# Patient Record
Sex: Male | Born: 1935 | Race: White | Hispanic: No | Marital: Married | Smoking: Current every day smoker
Health system: Southern US, Community
[De-identification: ages and names within clinical notes are randomized; demographics above are authoritative.]

## PROBLEM LIST (undated history)

## (undated) DIAGNOSIS — J45909 Unspecified asthma, uncomplicated: Secondary | ICD-10-CM

## (undated) DIAGNOSIS — J449 Chronic obstructive pulmonary disease, unspecified: Secondary | ICD-10-CM

## (undated) DIAGNOSIS — C801 Malignant (primary) neoplasm, unspecified: Secondary | ICD-10-CM

---

## 1999-01-06 ENCOUNTER — Ambulatory Visit (HOSPITAL_COMMUNITY): Admission: RE | Admit: 1999-01-06 | Discharge: 1999-01-06 | Payer: Self-pay | Admitting: Internal Medicine

## 2000-05-30 ENCOUNTER — Ambulatory Visit (HOSPITAL_COMMUNITY): Admission: RE | Admit: 2000-05-30 | Discharge: 2000-05-30 | Payer: Self-pay | Admitting: Gastroenterology

## 2000-05-30 ENCOUNTER — Encounter (INDEPENDENT_AMBULATORY_CARE_PROVIDER_SITE_OTHER): Payer: Self-pay | Admitting: Specialist

## 2000-08-16 ENCOUNTER — Ambulatory Visit (HOSPITAL_BASED_OUTPATIENT_CLINIC_OR_DEPARTMENT_OTHER): Admission: RE | Admit: 2000-08-16 | Discharge: 2000-08-16 | Payer: Self-pay | Admitting: Orthopedic Surgery

## 2000-08-16 ENCOUNTER — Emergency Department (HOSPITAL_COMMUNITY): Admission: EM | Admit: 2000-08-16 | Discharge: 2000-08-16 | Payer: Self-pay | Admitting: Emergency Medicine

## 2000-08-16 ENCOUNTER — Encounter: Payer: Self-pay | Admitting: Emergency Medicine

## 2003-08-01 ENCOUNTER — Encounter: Admission: RE | Admit: 2003-08-01 | Discharge: 2003-08-01 | Payer: Self-pay | Admitting: Internal Medicine

## 2003-08-19 ENCOUNTER — Encounter (INDEPENDENT_AMBULATORY_CARE_PROVIDER_SITE_OTHER): Payer: Self-pay | Admitting: *Deleted

## 2003-08-19 ENCOUNTER — Observation Stay (HOSPITAL_COMMUNITY): Admission: RE | Admit: 2003-08-19 | Discharge: 2003-08-20 | Payer: Self-pay | Admitting: Urology

## 2003-09-10 ENCOUNTER — Inpatient Hospital Stay (HOSPITAL_COMMUNITY): Admission: RE | Admit: 2003-09-10 | Discharge: 2003-09-20 | Payer: Self-pay | Admitting: Urology

## 2003-09-10 ENCOUNTER — Encounter (INDEPENDENT_AMBULATORY_CARE_PROVIDER_SITE_OTHER): Payer: Self-pay | Admitting: Specialist

## 2003-11-25 ENCOUNTER — Ambulatory Visit (HOSPITAL_COMMUNITY): Admission: RE | Admit: 2003-11-25 | Discharge: 2003-11-25 | Payer: Self-pay | Admitting: Urology

## 2003-12-19 ENCOUNTER — Ambulatory Visit (HOSPITAL_COMMUNITY): Admission: RE | Admit: 2003-12-19 | Discharge: 2003-12-19 | Payer: Self-pay | Admitting: Urology

## 2004-03-02 ENCOUNTER — Ambulatory Visit (HOSPITAL_COMMUNITY): Admission: RE | Admit: 2004-03-02 | Discharge: 2004-03-02 | Payer: Self-pay | Admitting: Urology

## 2004-06-01 ENCOUNTER — Ambulatory Visit (HOSPITAL_COMMUNITY): Admission: RE | Admit: 2004-06-01 | Discharge: 2004-06-01 | Payer: Self-pay | Admitting: Urology

## 2004-11-22 ENCOUNTER — Ambulatory Visit (HOSPITAL_COMMUNITY): Admission: RE | Admit: 2004-11-22 | Discharge: 2004-11-22 | Payer: Self-pay | Admitting: Urology

## 2005-03-02 ENCOUNTER — Ambulatory Visit (HOSPITAL_COMMUNITY): Admission: RE | Admit: 2005-03-02 | Discharge: 2005-03-02 | Payer: Self-pay | Admitting: Urology

## 2005-07-04 ENCOUNTER — Ambulatory Visit (HOSPITAL_COMMUNITY): Admission: RE | Admit: 2005-07-04 | Discharge: 2005-07-04 | Payer: Self-pay | Admitting: Urology

## 2005-10-18 ENCOUNTER — Ambulatory Visit (HOSPITAL_COMMUNITY): Admission: RE | Admit: 2005-10-18 | Discharge: 2005-10-18 | Payer: Self-pay | Admitting: Internal Medicine

## 2005-10-18 ENCOUNTER — Encounter: Payer: Self-pay | Admitting: Vascular Surgery

## 2007-12-12 ENCOUNTER — Encounter (INDEPENDENT_AMBULATORY_CARE_PROVIDER_SITE_OTHER): Payer: Self-pay | Admitting: Internal Medicine

## 2007-12-12 ENCOUNTER — Ambulatory Visit: Payer: Self-pay | Admitting: Surgery

## 2007-12-12 ENCOUNTER — Ambulatory Visit (HOSPITAL_COMMUNITY): Admission: RE | Admit: 2007-12-12 | Discharge: 2007-12-12 | Payer: Self-pay | Admitting: Internal Medicine

## 2008-10-24 ENCOUNTER — Encounter: Admission: RE | Admit: 2008-10-24 | Discharge: 2008-10-24 | Payer: Self-pay | Admitting: Internal Medicine

## 2009-12-16 ENCOUNTER — Encounter
Admission: RE | Admit: 2009-12-16 | Discharge: 2009-12-16 | Payer: Self-pay | Source: Home / Self Care | Admitting: Internal Medicine

## 2010-05-28 NOTE — Discharge Summary (Signed)
NAME:  Terry Valdez, Terry Valdez                           ACCOUNT NO.:  [id number]   MEDICAL RECORD NO.:  [id number]                   PATIENT TYPE:  OBV   LOCATION:  0381                                 FACILITY:  [facility]   PHYSICIAN:  Rozanna Boer., M.D.      DATE OF BIRTH:  Apr 13, 1921   DATE OF ADMISSION:  08/19/2003  DATE OF DISCHARGE:                                 DISCHARGE SUMMARY   DISCHARGE DIAGNOSES:  1. 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  2. Hematuria.  3. Solitary right kidney.   OPERATIONS/PROCEDURES:  Transurethral resection large bladder tumor, August 19, 2003.   BRIEF HISTORY:  This 77 year old patient is admitted with a 11-month history  of intermittent gross painless hematuria and was found to have multiple  bladder tumors on office cystoscopy.  His previous left kidney had been  removed at age 72 which was apparently a pelvic kidney that was painful.  His renal function was normal.   PAST MEDICAL HISTORY:  Positive for hypertension.  No myocardial infarction.  Negative urine culture. Renal ultrasound showed a normal right kidney.   MEDICATIONS:  1. Zocor 20 mg daily.  2. Hydrochlorothiazide 25 mg 1/2 tablet daily.  3. He has been of his aspirin for 1 week.   ALLERGIES:  No drug allergies.   SURGICAL HISTORY:  His only other surgery was an appendectomy at age 30 and  a hernia at age 67.   After satisfactory preop evaluation he was taken to the operating room where  he underwent a transurethral resection of a large bladder tumor that was  contiguous to sides of both left and right posterior base, trigone and  anterior wall, over 5 cm of bladder was resected.  He did well  postoperatively.  He received Mitomycin C 40 mg at 40 cc in the recovery  room.  Postoperatively his creatinine was 1.2, stable.  His hematocrit was  36 and stable, white count was normal.  His urine was clear.   DISPOSITION:  He  was sent home on the first postoperative day.  He and his  family were made aware of the pathology report and will most likely need a  cystectomy.  He will need to have further metastatic work up.  He went home  of the above medications plus Hydrocodone for pain.  He will call regarding any problems.  He has an appointment on August 26, 2003, will bring his family and discuss further treatment options at that  time.   CONDITION ON DISCHARGE:  He was sent home improved in ambulatory condition  on a regular diet.                                               [place]  Shelle Iron., M.D.    HMK/MEDQ  D:  08/20/2003  T:  08/20/2003  Job:  [id number]

## 2010-05-28 NOTE — Op Note (Signed)
NAME:  Terry Valdez, Terry Valdez                           ACCOUNT NO.:  [id number]   MEDICAL RECORD NO.:  [id number]                   PATIENT TYPE:  AMB   LOCATION:  DAY                                  FACILITY:  [facility]   PHYSICIAN:  Rozanna Boer., M.D.      DATE OF BIRTH:  1921-06-17   DATE OF PROCEDURE:  08/19/2003  DATE OF DISCHARGE:                                 OPERATIVE REPORT   PREOPERATIVE DIAGNOSIS:  Multiple bladder tumors in contiguous sites, left  posterior, right posterior, anterior wall, and trigone.   POSTOPERATIVE DIAGNOSES:  Multiple bladder tumors in contiguous sites, left  posterior, right posterior, anterior wall, and trigone.   OPERATION:  TUR bladder tumor (large greater than 5 cm).   ANESTHESIA:  Subarachnoid block.   SURGEON:  Courtney Paris, M.D.   BRIEF HISTORY:  This 77 year old patient presented with a 2-3 month history  of gross intermittent painless hematuria and was found to have multiple  bladder tumors on cystoscopy.  He had his left kidney removed at age 55.  Apparently, this was a pelvic kidney that was painful.  His renal function  tests were normal with a creatinine of 1.2.  He enters now for resection of  this rather extensive tumor which covered both the left and right posterior  base, trigone, and anterior wall in contiguous sites.  Because this is a  large resection and because of his age, he will be admitted for  postoperative care after surgery.   DESCRIPTION OF PROCEDURE:  The patient was placed on the operating table in  the dorsal lithotomy position.  After satisfactory induction of spinal  anesthesia, was prepped and draped with Betadine in the usual sterile  fashion.  The urethra was dilated with Sissy Hoff sounds to 28 [place] and 26  Olympus continuous-flow resectoscope was inserted, and the bladder  inspected.  Pictures were taken of the large tumors, the largest of which  were in the posterior base, both on the left and  right side with some fairly  large exophytic tumors.  There were several smaller tumors that were also  papillary in nature on the trigone, extending up to the left lateral wall  anteriorly and over onto the right lateral wall as well.  Using the  continuous-flow resectoscope with the bladder wall loop, these were  carefully shaved off the bladder.  An area of about a third of the bladder  wall was resected which included all of these tumors.  Hemostasis was good  with cautery, and the right orifice appeared to be close to but not involved  by the resection.  The left orifice was resected as he had had the previous  left kidney removed previously.  When the scope was removed, after removing  all the chips from the bladder, a 24 Ainsworth catheter was inserted, and  the irrigant was clear.  The patient was then taken to the recovery room  where he will have Mitomycin C instilled 40 mg in 40 mL in 30 timed minutes  and the bladder irrigated afterwards.  He will be kept for postoperative  recovery, but he went to the recovery room in satisfactory condition.                                               Rozanna Boer., M.D.    HMK/MEDQ  D:  08/19/2003  T:  08/19/2003  Job:  [id number]

## 2010-05-28 NOTE — Op Note (Signed)
[place]  Patient:    Terry Valdez, Terry Valdez                        MRN: [id number] Proc. Date: 08/16/00 Adm. Date:  [id number] Attending:  Marlowe Shores                           Operative Report  PREOPERATIVE DIAGNOSIS: Right hand volar lacerations of index and ring fingers.  POSTOPERATIVE DIAGNOSIS: Right hand volar lacerations of index and ring fingers.  OPERATION/PROCEDURE: Repair of profundus tendon distally, left index finger, and microscopic repair of radial digital nerve of index finger and right ring finger, as well as simple laceration repair of ring finger on the right.  SURGEON: Artist Pais. Mina Marble, M.D.  ASSISTANT: Junius Roads. Ireton, P.A.C.  ANESTHESIA: General.  TEQUIN TIME: Forty-eight minutes.  COMPLICATIONS: None.  DRAINS: None.  DESCRIPTION OF PROCEDURE: The patient was taken to the operating room and after induction of adequate general anesthesia the right upper extremity was prepped and draped in the usual sterile fashion.  An Esmarch was used to exsanguinate the limb and the tourniquet was inflated to 250 mm Hg.  At this point in time the index finger laceration was approached and debrided of clot and nonviable material.  There was an obvious open injury of the distal interphalangeal joint with laceration of 60-70% of the profundus tendon as well as the radial neurovascular bundle.  The profundus tendon was repaired with 4-0 Mersilene followed by a 6-0 epitendinous Prolene stitch.  After this was done the microscope was brought onto the field and under microscopic magnification the radial digital nerve was repaired using 9-0 nylon x 4 sutures.  This wound was thoroughly irrigated and then closed loosely with 5-0 nylon.  The ring finger also had a small volar laceration that was debrided of clot and nonviable material and simple closure undertaken using 5-0 nylon. That laceration was 2.5 cm in length.  The patient  then had Marcaine blocks to the fingers for postoperative pain control and was placed in a sterile dressing of Xeroform, 4 x 4s, fluffs, and dorsal extension block splint.  The patient tolerated the procedure well and went to the recovery room in stable fashion. DD:  08/16/00 TD:  08/17/00 Job: [id number]

## 2010-05-28 NOTE — Op Note (Signed)
NAME:  Terry Valdez, Terry Valdez                           ACCOUNT NO.:  [id number]   MEDICAL RECORD NO.:  [id number]                   PATIENT TYPE:  INP   LOCATION:  0163                                 FACILITY:  [facility]   PHYSICIAN:  Bertram Millard. Dahlstedt, M.D.          DATE OF BIRTH:  February 15, 1921   DATE OF PROCEDURE:  09/11/2003  DATE OF DISCHARGE:                                 OPERATIVE REPORT   PREOPERATIVE DIAGNOSIS:  Bladder cancer.   POSTOPERATIVE DIAGNOSIS:  Bladder cancer.   PROCEDURES PERFORMED:  1.  Radical cystoprostatectomy.  2.  Bilateral pelvic lymph node dissection.  3.  Creation of ileal conduit urinary diversion.   SURGEON:  Dr. Marcine Matar   ASSISTANT:  Dr. Thyra Breed   ANESTHESIA:  General endotracheal.   ESTIMATED BLOOD LOSS:  700 mL.   DRAINS:  1.  Ureteral stent into urostomy appliance.  2.  #10 Blake drain to bulb suction.   SPECIMENS:  1.  Bilateral pelvic lymph nodes.  2.  Prostate and bladder en bloc.   COMPLICATIONS:  None.   INDICATIONS FOR PROCEDURE:  Terry Valdez is a pleasant 76 year old male, who  was initially evaluated by Dr. Aldean Ast for intermittent gross hematuria.  Upon initial evaluation, it was also discovered that the patient had a  solitary kidney on the right following removal of a pelvic kidney at age 58.  Cystoscopy was performed and showed multiple bladder tumors.  The patient  then underwent transurethral resection of these tumors with a pathologic  diagnosis of high-grade invasive papillary urothelial carcinoma with deep  muscle involvement.  The patient also had a CT scan which showed no evidence  of the metastatic disease.  Given the findings, the patient was counseled on  the risks, benefits, and alternatives of undergoing a radical cystectomy  with ileal conduit urinary diversion for his solitary kidney.  The patient  understands all the risks, benefits, and alternatives and is willing to  proceed with the  procedure.   PROCEDURE IN DETAIL:  Following identification by his arm bracelet, the  patient was brought to the operating room and placed in the supine position.  Here, he received preoperative IV antibiotics and underwent success  induction of general endotracheal anesthesia.  His entire abdomen was then  shaved, prepped with Betadine, then draped in the usual sterile fashion.  We  then placed an 36 French Foley catheter and drained the bladder.  A midline  vertical infraumbilical incision was then made in the skin with a #15 blade  scalpel.  Bovie electrocautery was then used to incise Camper and Scarpa's  fascia down to the level of the rectus fascia.  The rectus sheath was then  incised using the Bovie, exposing the two bellies of the rectus abdominus  muscles.  The transversalis fascia between the two rectus muscles was then  incised using Metzenbaum scissors.  This revealed the space of Retzius.  This  was further dissected bluntly using the surgeon's hand.  At the  superior aspect of the wound, the peritoneal reflection was identified.  This was grasped with pick-ups and incised using Metzenbaum scissors.  There  were adhesions to the right of the midline incision from patient's previous  surgery including an appendectomy.  These adhesions were gently taken down  by sharp dissection with Metzenbaum scissors.  The rectus was then  identified and incised along the peritoneal reflection laterally.  The  medial umbilical ligaments were then identified, and a band of peritoneum  was incised just lateral to the ligaments down to the dome of the bladder.  We placed a Kocher clamp on the urachal tissue, and the lateral peritoneal  reflections were taken down until the lateral pedicles were easily  identified.  At this time, we turned our attention to identification of the  known solitary right ureter.  The right rectus peritoneum was then explored,  and a structure was identified entering  the bladder in a rather lateral  location.  It appeared to be ureteral, however, became easily dislodged from  the overlying bladder, and therefore could have been a ureteral remnant from  the patient's previous pelvic kidney.  At any rate, we continued searching  for the known right ureter until it was isolated.  It was then dissected  using sharp dissection in the Bovie down to the level of the bladder.  The  right ureter was then ligated near its insertion into the bladder with two  large Weck Hem-o-lok clips and incised.  The distal margin was then incised  and sent for frozen section.  The frozen section returned negative for  carcinoma.  There appeared to be adequate length after this mobilization to  the right ureter.  We then returned our attention to dissection of the  bladder and prostate specimen.  The posterior plane between the posterior  bladder and Denonvilliers fascia overlying the rectum was then developed  bluntly.  This was done by supporting the peritoneum just between the  bladder and the rectum.  Our posterior plane was nicely established and the  apex of the prostate could be palpated posteriorly.  This nicely developed  the lateral bladder pedicles which were taken down in a systematic fashion  using the LigaSure device and the Metzenbaum scissors.  After the lateral  pedicles were satisfactorily taken down, we then turned our attention to the  apex of the prostate.  The overlying superficial veins were cauterized, and  the fat was teased away from the dorsal venous complex and the puboprostatic  ligaments using a Kitner and suction.  The endopelvic fascia was then  incised bilaterally using Metzenbaum scissors.  The puboprostatic ligaments  were then incised on their extreme lateral attachments, providing excellent  exposure of the dorsal vein complex.  The posterior plane of the dorsal vein  complex and anterior to the urethra was then established using the  surgeon's thumb and forefinger.  The Stamey retractor was placed, retracting the  bladder upwards.  The Hohenfellner clamp was then placed beneath the dorsal  venous complex, and the dorsal vein complex was doubly ligated with two #1  Vicryl ties.  The Hohenfellner clamp was then replaced in the plane beneath  the dorsal venous complex, and the complex was incised using Bovie  electrocautery.  The dorsal venous complex was quite hemostatic.  A #2-0  Vicryl on the UR-5 needle was used on the lateral aspect in a figure-of-  eight  fashion to control one lateral area of bleeding.  Hemostasis was  excellent.  The urethra was then identified and incised using the Bovie  electrocautery.  The Foley catheter was then cut and pulled into the wound  to provide upward traction for further dissection on the specimen.  There  was very little posterior attachment to the prostate gland since the lateral  pedicles had been taken down.  The remaining attachments were then incised  using the LigaSure device and Bovie electrocautery.  The specimen was then  freed, as the bladder and prostate en bloc and sent for permanent pathologic  analysis.   We then turned our attention to the bilateral pelvic lymph node dissection.  The extent of the lymph node dissection of the lymph node of [place]oquet  inferiorly, anteriorly the iliac artery and posteriorly the obturator nerve.  Superiorly our limits of dissection were the iliac arteries.  The obturator  nerves were identified bilaterally and kept in plain view throughout the  entire dissection.  Large Weck Hem-o-lok clips were placed proximally and  distally on the lymph node package to prevent lymphocele formation and  obtain hemostasis.   Following this,, we turned our attention to creation of the ileal conduit.  There still remained some adhesions within the abdomen given the patient's  previous surgery, making it difficult to discern at least initially the   ileocecal valve.  Given the difficulty from adhesions, a more proximal part  of the bowel was utilized for creation of the ileal conduit.  In fact, we  had excellent length on the solitary ureter such that our conduit need only  be 10-15 cm in length.  We found an area of the bowel with adequate  mesentery to create our conduit.  The Bookwalter self-retaining retractor  was then reset to allow this portion of the procedure.  The mesentery was  then taken down both proximally and distally using the LigaSure device.  A  hemostat was then used to carefully dissect the mesentery away from the  bowel serosa at the proximal and distal resection sites.  The EndoGIA  stapler was then used to isolate this segment of ileum proximally and  distally.  We then reestablished continuity of the ileum.  The two cut ends  were grasped with Babcock clamps and placed side-to-side.  The staple line  was then incised at the antimesenteric corner of both segments of ileum. The EndoGIA devices were then separated and one half placed in each segment  of ileum.  It was then connected at the antimesenteric aspect of both pieces  of ileum and fired, establishing a side-to-side anastomosis.  The opened end  of the ileum was then closed with a TA-60 stapler.  The butt end of the  staple line was then oversewn with several 3-0 silk pop-off sutures.  The  proximal aspect of the side-to-side anastomosis was then secured using a 3-0  silk suture.  Palpably, the anastomosis felt very patent.  There was no  obvious bleeding from the staple line.  We then turned our attention to  creation of the ileal conduit.  Again, the right ureter had excellent  length, and the right ureter was quite distended at this point.  The Hem-o-  lok clip was then removed with Potts scissors.  The remaining bowel was then  packed out of the way to allow ureteroileal anastomosis.  The ureter was  then widely spatulated using Potts scissors.  The  anastomosis was done with  two #4-0 PDS suture on both sides in a running fashion to the butt end of  the ileal conduit.  The stomal end of the conduit was then opened.  A single-  J ureteral stent was passed into the ureter prior to completion of the  anastomosis.  The stoma end of the conduit was also irrigated with  antibiotic solution.  There appeared to be no leakage at the ureteroileal  anastomosis.  Furthermore, the stoma was very healthy-appearing with  adequate blood supply.  The ureteral stent was then tacked to the ileal  conduit with a single 4-0 chromic suture.   We then created our stoma by incising a small, approximately 3 cm circle of  skin previously marked by the stomal therapist.  This was done with the  Bovie electrocautery.  The core of subcutaneous tissue was then removed down  to the level of the fascia using the Bovie.  The fascia was then incised in  a cruciate fashion, and the muscle was incised using blunt dissection such  that two surgeon's fingers could be placed through the stoma.  A Babcock  clamp was then placed through the stoma and used to grasp the stomal end of  the ileal conduit and stent.  This was pulled through the abdominal wall.  Approximately four 3-0 Vicryl sutures were then used to begin maturation of  the stoma, everting mucosa nicely.  We then completed maturing the stoma  with several interrupted 3-0 chromic sutures.  The abdomen and pelvis were  then carefully explored, and there appeared to be no further bleeding.  Given the proximal nature of our bowel segment utilized, we then closed a  mesenteric trap to prevent bowel obstruction or herniation of bowel at a  later time with several interrupted 3-0 silk sutures.  There appeared to be  no tension on our conduit or anastomosis.  The wound was copiously irrigated  with antibiotic solution.  A #10 fluted Blake drain was then placed to the  left lower aspect of the incision.  A 3-0 nylon was  used to affix the drain to the skin.  All sponge, needle, and instrument counts were correct x 2 at  this point.  The rectus sheath was then reapproximated with a #1 running  PDS.  The subcutaneous tissues were copiously irrigated.  The skin was  reapproximated using surgical clips.  A stoma appliance was then placed over  the stoma which was pink and viable.  There was excellent ureteral output  through the stent.  The patient tolerated the procedure well, and there were  no complications.  Please note that Dr. Willow Ora was present and  participated in the entire procedure, as he was the responsible surgeon.   DISPOSITION:  After awaking from general anesthesia, the patient was  transported to the postanesthesia care unit in stable condition.  From here,  he will be transferred to the ICU for further postoperative management.     Thyra Breed, MD                            Bertram Millard. Retta Diones, M.D.    EG/MEDQ  D:  09/11/2003  T:  09/11/2003  Job:  [id number]

## 2010-05-28 NOTE — Discharge Summary (Signed)
Terry Valdez, CLINKSCALE                 ACCOUNT NO.:  [id number]   MEDICAL RECORD NO.:  [id number]          PATIENT TYPE:  INP   LOCATION:  0373                         FACILITY:  [facility]   PHYSICIAN:  Bertram Millard. Dahlstedt, M.D.DATE OF BIRTH:  03/22/1921   DATE OF ADMISSION:  09/10/2003  DATE OF DISCHARGE:  09/20/2003                                 DISCHARGE SUMMARY   PRIMARY DIAGNOSES:  1.  Invasive transitional cell carcinoma of the bladder.  2.  Chronic obstructive pulmonary disease.  3.  Hypertension.   PRINCIPAL PROCEDURE:  Cystectomy, ileal conduit on September 11, 2003.   BRIEF HISTORY:  This elderly male has a diagnosis of invasive transitional  cell carcinoma of the bladder.  He is status post left nephrectomy years ago  for benign process.  He was recently found to have invasive transitional  cell carcinoma of the bladder on the left lateral wall.  Metastatic survey  was negative.  He presents at this time for surgical management.   For medical history, please see admission note.   HOSPITAL COURSE:  The patient was admitted directly to the operating room.  He underwent cystectomy and formation of ileal conduit.  His postoperative  course was generally benign.  Eventually, by postoperative day five and six,  he had flatus and bowel movements, respectively.  He was advanced to a  regular diet.  He remained afebrile postoperatively.  By postoperative day  10, he had reached maximum hospital benefit.  He was discharged from the  hospital at that time.  His ostomy was working well.  Abdominal exam was  unremarkable.  He had stable lab function.   Final pathology revealed stage T3A urothelial carcinoma with negative lymph  nodes.  He did have an incidental finding of a Gleason 6 prostate cancer,  pathologic stage T2C.   He was discharged on his usual home medications plus Bactrim DS one p.o.  daily and Vicodin.  He will follow up in the near future for staple removal.      SMD/MEDQ  D:  11/12/2003  T:  11/12/2003  Job:  [id number]

## 2011-05-17 ENCOUNTER — Other Ambulatory Visit: Payer: Self-pay

## 2011-12-22 ENCOUNTER — Other Ambulatory Visit: Payer: Self-pay

## 2013-01-01 ENCOUNTER — Ambulatory Visit
Admission: RE | Admit: 2013-01-01 | Discharge: 2013-01-01 | Disposition: A | Discharge status: Home / Self Care | Payer: Medicare Other | Source: Ambulatory Visit | Attending: Internal Medicine | Admitting: Internal Medicine

## 2013-01-01 ENCOUNTER — Other Ambulatory Visit: Payer: Self-pay | Admitting: Internal Medicine

## 2013-01-01 DIAGNOSIS — R05 Cough: Secondary | ICD-10-CM

## 2013-08-21 ENCOUNTER — Other Ambulatory Visit: Payer: Self-pay | Admitting: Internal Medicine

## 2013-08-21 ENCOUNTER — Ambulatory Visit
Admission: RE | Admit: 2013-08-21 | Discharge: 2013-08-21 | Disposition: A | Discharge status: Home / Self Care | Payer: Medicare Other | Source: Ambulatory Visit | Attending: Internal Medicine | Admitting: Internal Medicine

## 2013-08-21 DIAGNOSIS — J449 Chronic obstructive pulmonary disease, unspecified: Secondary | ICD-10-CM

## 2013-08-26 ENCOUNTER — Other Ambulatory Visit: Payer: Self-pay | Admitting: Internal Medicine

## 2013-08-26 DIAGNOSIS — J449 Chronic obstructive pulmonary disease, unspecified: Secondary | ICD-10-CM

## 2013-08-28 ENCOUNTER — Ambulatory Visit
Admission: RE | Admit: 2013-08-28 | Discharge: 2013-08-28 | Disposition: A | Discharge status: Home / Self Care | Payer: Medicare Other | Source: Ambulatory Visit | Attending: Internal Medicine | Admitting: Internal Medicine

## 2013-08-28 DIAGNOSIS — J449 Chronic obstructive pulmonary disease, unspecified: Secondary | ICD-10-CM

## 2013-12-10 ENCOUNTER — Other Ambulatory Visit: Payer: Self-pay

## 2014-02-10 ENCOUNTER — Other Ambulatory Visit: Payer: Self-pay | Admitting: Internal Medicine

## 2014-02-10 ENCOUNTER — Ambulatory Visit
Admission: RE | Admit: 2014-02-10 | Discharge: 2014-02-10 | Disposition: A | Discharge status: Home / Self Care | Payer: Medicare Other | Source: Ambulatory Visit | Attending: Internal Medicine | Admitting: Internal Medicine

## 2014-02-10 DIAGNOSIS — M549 Dorsalgia, unspecified: Secondary | ICD-10-CM

## 2015-03-17 ENCOUNTER — Ambulatory Visit
Admission: RE | Admit: 2015-03-17 | Discharge: 2015-03-17 | Disposition: A | Discharge status: Home / Self Care | Payer: Medicare Other | Source: Ambulatory Visit | Attending: Internal Medicine | Admitting: Internal Medicine

## 2015-03-17 ENCOUNTER — Other Ambulatory Visit: Payer: Self-pay | Admitting: Internal Medicine

## 2015-03-17 DIAGNOSIS — J449 Chronic obstructive pulmonary disease, unspecified: Secondary | ICD-10-CM

## 2015-06-02 ENCOUNTER — Observation Stay (HOSPITAL_COMMUNITY)
Admission: EM | Admit: 2015-06-02 | Discharge: 2015-06-03 | Disposition: A | Discharge status: Home / Self Care | Payer: Medicare Other | Attending: Internal Medicine | Admitting: Internal Medicine

## 2015-06-02 ENCOUNTER — Emergency Department (HOSPITAL_COMMUNITY): Payer: Medicare Other

## 2015-06-02 ENCOUNTER — Encounter (HOSPITAL_COMMUNITY): Payer: Self-pay | Admitting: Emergency Medicine

## 2015-06-02 DIAGNOSIS — R9431 Abnormal electrocardiogram [ECG] [EKG]: Secondary | ICD-10-CM | POA: Diagnosis present

## 2015-06-02 DIAGNOSIS — I129 Hypertensive chronic kidney disease with stage 1 through stage 4 chronic kidney disease, or unspecified chronic kidney disease: Secondary | ICD-10-CM | POA: Insufficient documentation

## 2015-06-02 DIAGNOSIS — Z7982 Long term (current) use of aspirin: Secondary | ICD-10-CM | POA: Insufficient documentation

## 2015-06-02 DIAGNOSIS — J189 Pneumonia, unspecified organism: Principal | ICD-10-CM

## 2015-06-02 DIAGNOSIS — R531 Weakness: Secondary | ICD-10-CM | POA: Insufficient documentation

## 2015-06-02 DIAGNOSIS — Z66 Do not resuscitate: Secondary | ICD-10-CM | POA: Insufficient documentation

## 2015-06-02 DIAGNOSIS — IMO0002 Reserved for concepts with insufficient information to code with codable children: Secondary | ICD-10-CM

## 2015-06-02 DIAGNOSIS — R7989 Other specified abnormal findings of blood chemistry: Secondary | ICD-10-CM | POA: Diagnosis not present

## 2015-06-02 DIAGNOSIS — N182 Chronic kidney disease, stage 2 (mild): Secondary | ICD-10-CM | POA: Diagnosis not present

## 2015-06-02 DIAGNOSIS — M4854XA Collapsed vertebra, not elsewhere classified, thoracic region, initial encounter for fracture: Secondary | ICD-10-CM | POA: Insufficient documentation

## 2015-06-02 DIAGNOSIS — F17210 Nicotine dependence, cigarettes, uncomplicated: Secondary | ICD-10-CM | POA: Diagnosis not present

## 2015-06-02 DIAGNOSIS — J441 Chronic obstructive pulmonary disease with (acute) exacerbation: Secondary | ICD-10-CM | POA: Diagnosis not present

## 2015-06-02 DIAGNOSIS — I4581 Long QT syndrome: Secondary | ICD-10-CM | POA: Insufficient documentation

## 2015-06-02 DIAGNOSIS — D649 Anemia, unspecified: Secondary | ICD-10-CM | POA: Diagnosis not present

## 2015-06-02 DIAGNOSIS — J45901 Unspecified asthma with (acute) exacerbation: Secondary | ICD-10-CM | POA: Insufficient documentation

## 2015-06-02 DIAGNOSIS — R791 Abnormal coagulation profile: Secondary | ICD-10-CM

## 2015-06-02 DIAGNOSIS — E538 Deficiency of other specified B group vitamins: Secondary | ICD-10-CM | POA: Diagnosis not present

## 2015-06-02 DIAGNOSIS — R0602 Shortness of breath: Secondary | ICD-10-CM

## 2015-06-02 DIAGNOSIS — T148 Other injury of unspecified body region: Secondary | ICD-10-CM | POA: Diagnosis not present

## 2015-06-02 HISTORY — DX: Malignant (primary) neoplasm, unspecified: C80.1

## 2015-06-02 HISTORY — DX: Chronic obstructive pulmonary disease, unspecified: J44.9

## 2015-06-02 HISTORY — DX: Unspecified asthma, uncomplicated: J45.909

## 2015-06-02 LAB — BRAIN NATRIURETIC PEPTIDE: B Natriuretic Peptide: 239.5 pg/mL — ABNORMAL HIGH (ref 0.0–100.0)

## 2015-06-02 LAB — CBC
HCT: 39.6 % (ref 39.0–52.0)
Hemoglobin: 12.5 g/dL — ABNORMAL LOW (ref 13.0–17.0)
MCH: 32.1 pg (ref 26.0–34.0)
MCHC: 31.6 g/dL (ref 30.0–36.0)
MCV: 101.8 fL — AB (ref 78.0–100.0)
PLATELETS: 177 10*3/uL (ref 150–400)
RBC: 3.89 MIL/uL — ABNORMAL LOW (ref 4.22–5.81)
RDW: 14.5 % (ref 11.5–15.5)
WBC: 6.6 10*3/uL (ref 4.0–10.5)

## 2015-06-02 LAB — BASIC METABOLIC PANEL
Anion gap: 9 (ref 5–15)
BUN: 13 mg/dL (ref 6–20)
CALCIUM: 9 mg/dL (ref 8.9–10.3)
CO2: 24 mmol/L (ref 22–32)
CREATININE: 0.81 mg/dL (ref 0.61–1.24)
Chloride: 105 mmol/L (ref 101–111)
GFR calc Af Amer: >60 mL/min (ref >60)
GLUCOSE: 84 mg/dL (ref 65–99)
Potassium: 3.6 mmol/L (ref 3.5–5.1)
Sodium: 138 mmol/L (ref 135–145)

## 2015-06-02 LAB — D-DIMER, QUANTITATIVE: D-Dimer, Quant: 1.3 ug/mL-FEU — ABNORMAL HIGH (ref 0.00–0.50)

## 2015-06-02 MED ORDER — ENOXAPARIN SODIUM 40 MG/0.4ML ~~LOC~~ SOLN
40.0000 mg | SUBCUTANEOUS | Status: DC
  Administered 2015-06-03: 40 mg via SUBCUTANEOUS
  Filled 2015-06-02: qty 0.4

## 2015-06-02 MED ORDER — ACETAMINOPHEN 500 MG PO TABS
1000.0000 mg | ORAL_TABLET | Freq: Every day | ORAL | Status: DC
  Administered 2015-06-03: 1000 mg via ORAL

## 2015-06-02 MED ORDER — IOPAMIDOL (ISOVUE-370) INJECTION 76%
INTRAVENOUS | Status: AC
  Administered 2015-06-02: 100 mL
  Filled 2015-06-02: qty 100

## 2015-06-02 MED ORDER — ALBUTEROL SULFATE (2.5 MG/3ML) 0.083% IN NEBU
5.0000 mg | INHALATION_SOLUTION | Freq: Once | RESPIRATORY_TRACT | Status: AC
  Administered 2015-06-02: 5 mg via RESPIRATORY_TRACT
  Filled 2015-06-02: qty 6

## 2015-06-02 MED ORDER — HYDROCHLOROTHIAZIDE 25 MG PO TABS
12.5000 mg | ORAL_TABLET | Freq: Every day | ORAL | Status: DC
  Administered 2015-06-03: 12.5 mg via ORAL
  Filled 2015-06-02: qty 1

## 2015-06-02 MED ORDER — IPRATROPIUM BROMIDE 0.02 % IN SOLN
0.5000 mg | Freq: Once | RESPIRATORY_TRACT | Status: AC
  Administered 2015-06-02: 0.5 mg via RESPIRATORY_TRACT
  Filled 2015-06-02: qty 2.5

## 2015-06-02 MED ORDER — BUDESONIDE 0.5 MG/2ML IN SUSP
0.5000 mg | Freq: Two times a day (BID) | RESPIRATORY_TRACT | Status: DC
  Administered 2015-06-03: 0.5 mg via RESPIRATORY_TRACT
  Filled 2015-06-02 (×2): qty 2

## 2015-06-02 MED ORDER — METHYLPREDNISOLONE SODIUM SUCC 125 MG IJ SOLR
125.0000 mg | Freq: Once | INTRAMUSCULAR | Status: AC
  Administered 2015-06-02: 125 mg via INTRAVENOUS
  Filled 2015-06-02: qty 2

## 2015-06-02 MED ORDER — LEVOFLOXACIN IN D5W 750 MG/150ML IV SOLN
750.0000 mg | INTRAVENOUS | Status: DC
  Administered 2015-06-02: 750 mg via INTRAVENOUS
  Filled 2015-06-02 (×2): qty 150

## 2015-06-02 MED ORDER — ARFORMOTEROL TARTRATE 15 MCG/2ML IN NEBU
15.0000 ug | INHALATION_SOLUTION | Freq: Two times a day (BID) | RESPIRATORY_TRACT | Status: DC
  Administered 2015-06-03: 15 ug via RESPIRATORY_TRACT
  Filled 2015-06-02 (×2): qty 2

## 2015-06-02 MED ORDER — PREDNISONE 20 MG PO TABS
40.0000 mg | ORAL_TABLET | Freq: Every day | ORAL | Status: DC
  Administered 2015-06-03: 40 mg via ORAL
  Filled 2015-06-02: qty 2

## 2015-06-02 MED ORDER — GUAIFENESIN ER 600 MG PO TB12
600.0000 mg | ORAL_TABLET | Freq: Two times a day (BID) | ORAL | Status: DC
  Administered 2015-06-03 (×2): 600 mg via ORAL
  Filled 2015-06-02: qty 1

## 2015-06-02 MED ORDER — ASPIRIN EC 81 MG PO TBEC
81.0000 mg | DELAYED_RELEASE_TABLET | Freq: Every day | ORAL | Status: DC
  Administered 2015-06-03: 81 mg via ORAL
  Filled 2015-06-02: qty 1

## 2015-06-02 MED ORDER — IPRATROPIUM-ALBUTEROL 0.5-2.5 (3) MG/3ML IN SOLN
3.0000 mL | Freq: Four times a day (QID) | RESPIRATORY_TRACT | Status: DC
  Administered 2015-06-03: 3 mL via RESPIRATORY_TRACT
  Filled 2015-06-02: qty 3

## 2015-06-02 NOTE — ED Notes (Signed)
Unable to give report

## 2015-06-02 NOTE — ED Notes (Signed)
Per [facility] patient from PCP's office for shortness of breath with cough.  On arrival patient receiving breathing treatment from EMS for wheezing.  Patient states he fell a few weeks ago on his left side and has had left sided rib pain since, rated 8/10.  Patient is alert and answering questions appropriately at this time.

## 2015-06-02 NOTE — ED Notes (Addendum)
The pt returned from c-t  Iv med  Almost infused.  Pt c/o being cold temp checked 97.8

## 2015-06-02 NOTE — H&P (Signed)
History and Physical    Terry Valdez [id number] DOB: 07/07/1921 DOA: 06/02/2015  Referring MD/NP/PA: Dr. Canary Brim  PCP: Henrine Screws, MD  Patient coming from:  Primary care provider office  Chief Complaint: Cough and shortness of breath  HPI: Terry Valdez is a 82 y.o. male with medical history significant of COPD and hypertension; who presents with cough and shortness of breath. History obtained from the patient and patient's family member present at bedside. Patient had been seen at his primary care office today with complaints of cough and shortness of breath. Apparently patient had decreased oxygen saturations and there was question of the patient being a Nitrol fibrillation for which EMS was called and he was transported here to the hospital. Patient notes that over the last 3 or 4 days he's had increasing cough with yellow to dark sputum production. Sputum production is thickened nature and has had associated symptoms of shortness of breath, wheezing, and left lower pleuritic chest pain. Pain on his left lower rib cage is 8 out of 10. Patient was reported to have fallen a few weeks ago, but at baseline ambulates around with the use of a walker. Family is unsure of his last time requiring prednisone exactly, but states it may have been in the last 3 months. Normally patients treated as an outpatient with steroids and antibiotics when he developed similar symptoms. Patient denies any change in urine, fever, chills, chest pain, new rash,change in appetite,  focal weakness, or changes in vision. Patient complains of weight loss, but family member notes that his weight has been relatively stable over the last few months averaging anywhere from 90-100 pounds.   ED Course: Upon admission to the emergency department patient was evaluated seen to be afebrile, pulse up to 101, respirations of 38, blood pressure maintained, and O2 saturations greater than 90%. Lab work was relatively unremarkable.  Chest x-ray showed a left lower lobe infiltrate. Patient will start on Levaquin for possible pneumonia.  Review of Systems: As per HPI otherwise 10 point review of systems negative.   Past Medical History  Diagnosis Date  . Cancer ([place])   . COPD (chronic obstructive pulmonary disease) ([place])   . Asthma     History reviewed. No pertinent past surgical history.   reports that he has been smoking Cigarettes.  He has a 21 pack-year smoking history. He does not have any smokeless tobacco history on file. He reports that he drinks alcohol. His drug history is not on file.  No Known Allergies  History reviewed and no pertinent family history obtained.   Prior to Admission medications   Not on File    Physical Exam: Filed Vitals:   06/02/15 2000 06/02/15 2030 06/02/15 2125 06/02/15 2130  BP: 108/96 106/78  121/74  Pulse: 85 85  101  Temp:   97.8 F (36.6 C)   TempSrc:      Resp: 23 23  24   Height:      Weight:      SpO2: 95% 95%  96%      Constitutional: Patient is a frail elderly male who appears chronically ill, but currently in no acute distress several to follow commands.  Filed Vitals:   06/02/15 2000 06/02/15 2030 06/02/15 2125 06/02/15 2130  BP: 108/96 106/78  121/74  Pulse: 85 85  101  Temp:   97.8 F (36.6 C)   TempSrc:      Resp: 23 23  24   Height:  Weight:      SpO2: 95% 95%  96%   Eyes: PERRL, lids and conjunctivae normal ENMT: Hard of hearing. Mucous membranes are moist. Posterior pharynx clear of any exudate or lesions.Normal dentition.  Neck: normal, supple, no masses, no thyromegaly Respiratory: Tachypneic, bilateral wheezes, with rhonchi and rales appreciated. Cardiovascular: Regular rate and rhythm, no murmurs / rubs / gallops. No extremity edema. 2+ pedal pulses. No carotid bruits.  Abdomen: no tenderness, no masses palpated. No hepatosplenomegaly. Bowel sounds positive.  Musculoskeletal: no clubbing / cyanosis. Kyphosis noted. Fair ROM, no  contractures. Normal muscle tone.  Skin: Multiple areas of small bruising, lesions, ulcers. No induration Neurologic: CN 2-12 grossly intact. Sensation intact, DTR normal. Strength 5/5 in all 4.  Psychiatric: Normal judgment and insight. Alert and oriented x 3. Normal mood.     Labs on Admission: I have personally reviewed following labs and imaging studies  CBC:  Recent Labs Lab 06/02/15 1536  WBC 6.6  HGB 12.5*  HCT 39.6  MCV 101.8*  PLT 123XX123   Basic Metabolic Panel:  Recent Labs Lab 06/02/15 1536  NA 138  K 3.6  CL 105  CO2 24  GLUCOSE 84  BUN 13  CREATININE 0.81  CALCIUM 9.0   GFR: Estimated Creatinine Clearance: 37.4 mL/min (by C-G formula based on Cr of 0.81). Liver Function Tests: No results for input(s): AST, ALT, ALKPHOS, BILITOT, PROT, ALBUMIN in the last 168 hours. No results for input(s): LIPASE, AMYLASE in the last 168 hours. No results for input(s): AMMONIA in the last 168 hours. Coagulation Profile: No results for input(s): INR, PROTIME in the last 168 hours. Cardiac Enzymes: No results for input(s): CKTOTAL, CKMB, CKMBINDEX, TROPONINI in the last 168 hours. BNP (last 3 results) No results for input(s): PROBNP in the last 8760 hours. HbA1C: No results for input(s): HGBA1C in the last 72 hours. CBG: No results for input(s): GLUCAP in the last 168 hours. Lipid Profile: No results for input(s): CHOL, HDL, LDLCALC, TRIG, CHOLHDL, LDLDIRECT in the last 72 hours. Thyroid Function Tests: No results for input(s): TSH, T4TOTAL, FREET4, T3FREE, THYROIDAB in the last 72 hours. Anemia Panel: No results for input(s): VITAMINB12, FOLATE, FERRITIN, TIBC, IRON, RETICCTPCT in the last 72 hours. Urine analysis: No results found for: COLORURINE, APPEARANCEUR, LABSPEC, PHURINE, GLUCOSEU, HGBUR, BILIRUBINUR, KETONESUR, PROTEINUR, UROBILINOGEN, NITRITE, LEUKOCYTESUR Sepsis Labs: No results found for this or any previous visit (from the past 240 hour(s)).    Radiological Exams on Admission: Dg Chest 2 View  06/02/2015  CLINICAL DATA:  Acute onset of shortness of breath and cough. Initial encounter. EXAM: CHEST  2 VIEW COMPARISON:  Chest radiograph performed 03/17/2015 FINDINGS: The lungs are hyperexpanded, with flattening of the hemidiaphragms, compatible with COPD. Mild left basilar opacity could reflect pneumonia. There is no evidence of focal opacification, pleural effusion or pneumothorax. The heart is borderline enlarged. No acute osseous abnormalities are seen. IMPRESSION: 1. Mild left basilar opacity raises concern for pneumonia. 2. Findings of COPD. 3. Borderline cardiomegaly. Electronically Signed   By: Garald Balding M.D.   On: 06/02/2015 18:46   Dg Ribs Unilateral Left  06/02/2015  CLINICAL DATA:  Acute onset of shortness of breath and left-sided rib pain. Status post fall 3 weeks ago. Initial encounter. EXAM: LEFT RIBS - 2 VIEW COMPARISON:  Chest radiograph performed 03/17/2015 FINDINGS: No displaced rib fractures are seen. The lungs are hyperexpanded, with flattening of the hemidiaphragms, compatible with COPD. Mild left opacity raises concern for pneumonia. There is no evidence  of pleural effusion or pneumothorax. The cardiomediastinal silhouette is within normal limits. No acute osseous abnormalities are seen. IMPRESSION: 1. Mild left basilar airspace opacity raises concern for pneumonia. 2. No displaced rib fracture seen. 3. Findings of COPD. Electronically Signed   By: Garald Balding M.D.   On: 06/02/2015 18:49   Ct Angio Chest Pe W/cm &/or Wo Cm  06/02/2015  CLINICAL DATA:  Shortness of breath and cough with elevated D-dimer. EXAM: CT ANGIOGRAPHY CHEST WITH CONTRAST TECHNIQUE: Multidetector CT imaging of the chest was performed using the standard protocol during bolus administration of intravenous contrast. Multiplanar CT image reconstructions and MIPs were obtained to evaluate the vascular anatomy. CONTRAST:  80 mL Isovue 370 IV  COMPARISON:  CT 08/28/2013 and 12/16/2009 as well as chest x-ray 06/02/2015 FINDINGS: Lungs are well inflated with mild diffuse emphysematous disease. There is mild biapical scarring unchanged. Few small calcified granulomas centrally. There is an airspace process over left lower lobe with small left effusion likely pneumonia. Subtle opacification posterior medial right base which may be due to atelectasis or infection. There is complete opacification over left lower lobe airways extending to the region of the airspace consolidation. This may be due to mucous plugging versus aspiration. There is mild cardiomegaly. There is no evidence of pulmonary embolism. Calcified plaque is present over left main and 3 vessel coronary arteries. There is calcified plaque over thoracic aorta. There is no mediastinal or hilar adenopathy. Remaining mediastinal structures are unremarkable. Pectus excavatum deformity is present. Images through upper abdomen demonstrate calcified plaque over abdominal aorta. Absence of the left kidney. There are degenerative changes of the spine. There are a 2 adjacent moderate compression fractures over the mid thoracic spine with a mild compression fracture over the lower thoracic spine. The 2 new since 08/28/2013. The mild lower thoracic spine compression fractures new compared to a chest x-ray 03/17/2015. Review of the MIP images confirms the above findings. IMPRESSION: No evidence of pulmonary emboli. Airspace process over the posterior left lower lobe compatible with pneumonia. Small left effusion. Opacification over the airways in the left lower lobe likely mucous plugging versus aspiration and less likely endobronchial mass. Recommend follow-up CT in 4 weeks. Mild sinus disease. Mild cardiomegaly with atherosclerotic coronary artery disease. Two adjacent moderate compression fractures over the mid to upper thoracic spine unchanged from 03/17/2015 with new mild compression deformity over the  lower thoracic spine since 03/17/2015. Electronically Signed   By: Marin Olp M.D.   On: 06/02/2015 21:47    EKG: Independently reviewed. Sinus rhythm with signs of left anterior fascicular block and prolonged QTC of 513  Assessment/Plan Community acquired pneumonia with acute on chronic COPD exacerbation: Acute. Patient with increased cough and sputum production over the last 3-4 days. Physical exam wheezing and rhonchi noted. CXR showing a left lower lobe infiltrate. Patient empirically started on Levaquin given multiple breathing treatments and 125 mg of Solu-Medrol in the ED. - Admit to a MedSurg bed - Follow-up blood and sputum studies - Empiric antibiotics of Levaquin - DuoNeb's 4 times a day - Budesonide and Brovana nebs - Prednisone 40 mg po q daily - Mucinex  - Patient declined continuous pulse oximetry monitoring overnight, so will check with vital signs  Anemia: Hemoglobin noted to be 12.5 on admission with elevated MCV of 101.8. - Check CBC in a.m. - Check vitamin B12 and folate   Prolonged QTC: Acute. QTC prolonged at 513 Not seen on previous tracing. - Check troponin 1  - Continue  to monitor  Compression fracture: New mild compression deformity over the lower thoracic spine since 03/17/2015 noted on CT. - Continue to monitor   Elevated d-dimer: D-dimer noted to be 1.3 on admissio, but CT angiogram of the chest negative for any acute signs of pulmonary emboli. Patient with no complaints of leg swelling. - Continue to monitor   DVT prophylaxis: lovenox Code Status: DO NOT RESUSCITATE Family Communication: Discussed plan with family at bedside Disposition Plan:  discharge home in 1-2 days   Consults called: None Admission status: MedSurg observation   Norval Morton MD [facility] Pager [phone]  If 7PM-7AM, please contact night-coverage [email or web address] Password [facility]  06/02/2015, 10:21 PM

## 2015-06-02 NOTE — ED Notes (Signed)
Pt coughing.

## 2015-06-02 NOTE — ED Provider Notes (Signed)
CSN: [id number]     Arrival date & time 06/02/15  1616 History   First MD Initiated Contact with Patient 06/02/15 1625     Chief Complaint  Patient presents with  . Shortness of Breath     (Consider location/radiation/quality/duration/timing/severity/associated sxs/prior Treatment) HPI  Pt with hx of COPD presenting with c/o shortness of breath and increased cough and increased sputum.  Pt states symptoms have been worsening over the past several days.  He states he has chronic cough but it is becoming worse.  Denies chest pain.  No fever/chills.  No leg swelling.  He went to his PMDs office- he was wheezing there and given breathing treatment and brought to the ED by EMS.  He also c/o left rib pain after a fall several weeks ago.  Pt states his breathing is improved after breathing treatment.  There are no other associated systemic symptoms, there are no other alleviating or modifying factors.   Past Medical History  Diagnosis Date  . Cancer ([place])   . COPD (chronic obstructive pulmonary disease) ([place])   . Asthma    History reviewed. No pertinent past surgical history. History reviewed. No pertinent family history. Social History  Substance Use Topics  . Smoking status: Current Every Day Smoker -- 0.30 packs/day for 70 years    Types: Cigarettes  . Smokeless tobacco: None  . Alcohol Use: Yes     Comment: occasional    Review of Systems  ROS reviewed and all otherwise negative except for mentioned in HPI    Allergies  Review of patient's allergies indicates no known allergies.  Home Medications   Prior to Admission medications   Medication Sig Start Date End Date Taking? Authorizing Provider  acetaminophen (TYLENOL) 500 MG tablet Take 1,000 mg by mouth at bedtime.   Yes Historical Provider, MD  aspirin EC 81 MG tablet Take 81 mg by mouth daily.   Yes Historical Provider, MD  SYMBICORT 160-4.5 MCG/ACT inhaler INL 2 PFS PO BID 05/11/15  Yes Historical Provider, MD  albuterol  (PROVENTIL HFA;VENTOLIN HFA) 108 (90 Base) MCG/ACT inhaler Inhale 2 puffs into the lungs every 6 (six) hours as needed for wheezing or shortness of breath. 06/03/15   Lavina Hamman, MD  amoxicillin-clavulanate (AUGMENTIN) 500-125 MG tablet Take 1 tablet (500 mg total) by mouth 2 (two) times daily. 06/03/15   Lavina Hamman, MD  guaiFENesin (MUCINEX) 600 MG 12 hr tablet Take 1 tablet (600 mg total) by mouth 2 (two) times daily. 06/03/15   Lavina Hamman, MD  predniSONE (DELTASONE) 10 MG tablet Take 40mg  daily for 3days,Take 30mg  daily for 3days,Take 20mg  daily for 3days,Take 10mg  daily for 3days, then stop. 06/03/15   Lavina Hamman, MD  tiotropium (SPIRIVA HANDIHALER) 18 MCG inhalation capsule Place 1 capsule (18 mcg total) into inhaler and inhale daily. 06/03/15   Lavina Hamman, MD   BP 110/48 mmHg  Pulse 81  Temp(Src) 97.5 F (36.4 C) (Oral)  Resp 16  Ht 5\' 6"  (1.676 m)  Wt 95 lb 7.4 oz (43.3 kg)  BMI 15.41 kg/m2  SpO2 96%  Vitals reviewed Physical Exam  Physical Examination: General appearance - alert, frail appearing, and in no distress Mental status - alert, oriented to person, place, and time Eyes - no conjunctival injection, no scleral icterus Mouth - mucous membranes moist, pharynx normal without lesions Chest - BSS, bilateral mild wheezing, mild tachypnea, frequent cough Heart - normal rate, regular rhythm, normal S1, S2, no murmurs, rubs,  clicks or gallops Abdomen - soft, nontender, nondistended, no masses or organomegaly Neurological - alert, oriented, normal speech Extremities - peripheral pulses normal, no pedal edema, no clubbing or cyanosis Skin - normal coloration and turgor, no rash  ED Course  Procedures (including critical care time) Labs Review Labs Reviewed  CBC - Abnormal; Notable for the following:    RBC 3.89 (*)    Hemoglobin 12.5 (*)    MCV 101.8 (*)    All other components within normal limits  D-DIMER, QUANTITATIVE (NOT AT [facility]) - Abnormal; Notable for  the following:    D-Dimer, Quant 1.30 (*)    All other components within normal limits  BRAIN NATRIURETIC PEPTIDE - Abnormal; Notable for the following:    B Natriuretic Peptide 239.5 (*)    All other components within normal limits  URINALYSIS, ROUTINE W REFLEX MICROSCOPIC (NOT AT [facility]) - Abnormal; Notable for the following:    APPearance CLOUDY (*)    Hgb urine dipstick MODERATE (*)    Leukocytes, UA MODERATE (*)    All other components within normal limits  CBC WITH DIFFERENTIAL/PLATELET - Abnormal; Notable for the following:    RBC 3.58 (*)    Hemoglobin 11.2 (*)    HCT 36.1 (*)    MCV 100.8 (*)    Lymphs Abs 0.4 (*)    All other components within normal limits  BASIC METABOLIC PANEL - Abnormal; Notable for the following:    Glucose, Bld 127 (*)    Calcium 8.8 (*)    All other components within normal limits  URINE MICROSCOPIC-ADD ON - Abnormal; Notable for the following:    Squamous Epithelial / LPF 0-5 (*)    Bacteria, UA FEW (*)    Casts HYALINE CASTS (*)    All other components within normal limits  CULTURE, BLOOD (ROUTINE X 2)  CULTURE, BLOOD (ROUTINE X 2)  CULTURE, EXPECTORATED SPUTUM-ASSESSMENT  GRAM STAIN  BASIC METABOLIC PANEL  HIV ANTIBODY (ROUTINE TESTING)  STREP PNEUMONIAE URINARY ANTIGEN  VITAMIN B12  TROPONIN I  LEGIONELLA PNEUMOPHILA SEROGP 1 UR AG  FOLATE RBC    Imaging Review Dg Chest 2 View  06/02/2015  CLINICAL DATA:  Acute onset of shortness of breath and cough. Initial encounter. EXAM: CHEST  2 VIEW COMPARISON:  Chest radiograph performed 03/17/2015 FINDINGS: The lungs are hyperexpanded, with flattening of the hemidiaphragms, compatible with COPD. Mild left basilar opacity could reflect pneumonia. There is no evidence of focal opacification, pleural effusion or pneumothorax. The heart is borderline enlarged. No acute osseous abnormalities are seen. IMPRESSION: 1. Mild left basilar opacity raises concern for pneumonia. 2. Findings of COPD. 3.  Borderline cardiomegaly. Electronically Signed   By: Garald Balding M.D.   On: 06/02/2015 18:46   Dg Ribs Unilateral Left  06/02/2015  CLINICAL DATA:  Acute onset of shortness of breath and left-sided rib pain. Status post fall 3 weeks ago. Initial encounter. EXAM: LEFT RIBS - 2 VIEW COMPARISON:  Chest radiograph performed 03/17/2015 FINDINGS: No displaced rib fractures are seen. The lungs are hyperexpanded, with flattening of the hemidiaphragms, compatible with COPD. Mild left opacity raises concern for pneumonia. There is no evidence of pleural effusion or pneumothorax. The cardiomediastinal silhouette is within normal limits. No acute osseous abnormalities are seen. IMPRESSION: 1. Mild left basilar airspace opacity raises concern for pneumonia. 2. No displaced rib fracture seen. 3. Findings of COPD. Electronically Signed   By: Garald Balding M.D.   On: 06/02/2015 18:49   Ct Angio Chest  Pe W/cm &/or Wo Cm  06/02/2015  CLINICAL DATA:  Shortness of breath and cough with elevated D-dimer. EXAM: CT ANGIOGRAPHY CHEST WITH CONTRAST TECHNIQUE: Multidetector CT imaging of the chest was performed using the standard protocol during bolus administration of intravenous contrast. Multiplanar CT image reconstructions and MIPs were obtained to evaluate the vascular anatomy. CONTRAST:  80 mL Isovue 370 IV COMPARISON:  CT 08/28/2013 and 12/16/2009 as well as chest x-ray 06/02/2015 FINDINGS: Lungs are well inflated with mild diffuse emphysematous disease. There is mild biapical scarring unchanged. Few small calcified granulomas centrally. There is an airspace process over left lower lobe with small left effusion likely pneumonia. Subtle opacification posterior medial right base which may be due to atelectasis or infection. There is complete opacification over left lower lobe airways extending to the region of the airspace consolidation. This may be due to mucous plugging versus aspiration. There is mild cardiomegaly. There is  no evidence of pulmonary embolism. Calcified plaque is present over left main and 3 vessel coronary arteries. There is calcified plaque over thoracic aorta. There is no mediastinal or hilar adenopathy. Remaining mediastinal structures are unremarkable. Pectus excavatum deformity is present. Images through upper abdomen demonstrate calcified plaque over abdominal aorta. Absence of the left kidney. There are degenerative changes of the spine. There are a 2 adjacent moderate compression fractures over the mid thoracic spine with a mild compression fracture over the lower thoracic spine. The 2 new since 08/28/2013. The mild lower thoracic spine compression fractures new compared to a chest x-ray 03/17/2015. Review of the MIP images confirms the above findings. IMPRESSION: No evidence of pulmonary emboli. Airspace process over the posterior left lower lobe compatible with pneumonia. Small left effusion. Opacification over the airways in the left lower lobe likely mucous plugging versus aspiration and less likely endobronchial mass. Recommend follow-up CT in 4 weeks. Mild sinus disease. Mild cardiomegaly with atherosclerotic coronary artery disease. Two adjacent moderate compression fractures over the mid to upper thoracic spine unchanged from 03/17/2015 with new mild compression deformity over the lower thoracic spine since 03/17/2015. Electronically Signed   By: Marin Olp M.D.   On: 06/02/2015 21:47   I have personally reviewed and evaluated these images and lab results as part of my medical decision-making.   EKG Interpretation   Date/Time:  Tuesday Jun 02 2015 16:37:15 EDT Ventricular Rate:  87 PR Interval:  164 QRS Duration: 106 QT Interval:  443 QTC Calculation: 533 R Axis:   -75 Text Interpretation:  Sinus rhythm LAD, consider left anterior fascicular  block Anterior infarct, old Prolonged QT interval Since previous tracing  QT interval is prolonged Confirmed by Canary Brim  MD, Rashun Grattan [phone]) on   06/02/2015 5:47:39 PM      MDM   Final diagnoses:  COPD exacerbation ([place])  Community acquired pneumonia    Pt presenting with worsening cough adn shortness of breath- pt feels improved after duonebs in the ED, started on solumedrol for COPD exacerbation.  Xray shows pneumonia- started on iv abx- elevated d-dimer and CT scan obtained which confirmed pneumonia but no evidence of PE.  Pt admitted to medical service for further management.    10:22 PM d/w Dr. Tamala Julian, [facility], pt to be admitted to obs, med/surg bed.   Alfonzo Beers, MD 06/03/15 1620

## 2015-06-02 NOTE — ED Notes (Signed)
pts urostomy emptied  250 urine clear no odor

## 2015-06-02 NOTE — ED Notes (Signed)
Report given to rn  On 5w

## 2015-06-02 NOTE — ED Notes (Signed)
Alert no distress 

## 2015-06-03 ENCOUNTER — Encounter (HOSPITAL_COMMUNITY): Payer: Self-pay

## 2015-06-03 DIAGNOSIS — T148 Other injury of unspecified body region: Secondary | ICD-10-CM

## 2015-06-03 DIAGNOSIS — I4581 Long QT syndrome: Secondary | ICD-10-CM | POA: Diagnosis not present

## 2015-06-03 DIAGNOSIS — R9431 Abnormal electrocardiogram [ECG] [EKG]: Secondary | ICD-10-CM | POA: Diagnosis present

## 2015-06-03 DIAGNOSIS — R7989 Other specified abnormal findings of blood chemistry: Secondary | ICD-10-CM | POA: Diagnosis present

## 2015-06-03 DIAGNOSIS — J441 Chronic obstructive pulmonary disease with (acute) exacerbation: Secondary | ICD-10-CM | POA: Diagnosis present

## 2015-06-03 DIAGNOSIS — D649 Anemia, unspecified: Secondary | ICD-10-CM | POA: Diagnosis present

## 2015-06-03 DIAGNOSIS — J189 Pneumonia, unspecified organism: Principal | ICD-10-CM

## 2015-06-03 DIAGNOSIS — J45901 Unspecified asthma with (acute) exacerbation: Secondary | ICD-10-CM

## 2015-06-03 DIAGNOSIS — IMO0002 Reserved for concepts with insufficient information to code with codable children: Secondary | ICD-10-CM

## 2015-06-03 LAB — BASIC METABOLIC PANEL
Anion gap: 10 (ref 5–15)
BUN: 11 mg/dL (ref 6–20)
CHLORIDE: 105 mmol/L (ref 101–111)
CO2: 23 mmol/L (ref 22–32)
CREATININE: 0.79 mg/dL (ref 0.61–1.24)
Calcium: 8.8 mg/dL — ABNORMAL LOW (ref 8.9–10.3)
GFR calc non Af Amer: >60 mL/min (ref >60)
Glucose, Bld: 127 mg/dL — ABNORMAL HIGH (ref 65–99)
POTASSIUM: 4 mmol/L (ref 3.5–5.1)
Sodium: 138 mmol/L (ref 135–145)

## 2015-06-03 LAB — CBC WITH DIFFERENTIAL/PLATELET
BASOS ABS: 0 10*3/uL (ref 0.0–0.1)
BASOS PCT: 0 %
EOS ABS: 0 10*3/uL (ref 0.0–0.7)
EOS PCT: 0 %
HCT: 36.1 % — ABNORMAL LOW (ref 39.0–52.0)
Hemoglobin: 11.2 g/dL — ABNORMAL LOW (ref 13.0–17.0)
LYMPHS ABS: 0.4 10*3/uL — AB (ref 0.7–4.0)
Lymphocytes Relative: 8 %
MCH: 31.3 pg (ref 26.0–34.0)
MCHC: 31 g/dL (ref 30.0–36.0)
MCV: 100.8 fL — AB (ref 78.0–100.0)
Monocytes Absolute: 0.1 10*3/uL (ref 0.1–1.0)
Monocytes Relative: 2 %
Neutro Abs: 4.1 10*3/uL (ref 1.7–7.7)
Neutrophils Relative %: 90 %
Platelets: 181 10*3/uL (ref 150–400)
RBC: 3.58 MIL/uL — AB (ref 4.22–5.81)
RDW: 14.1 % (ref 11.5–15.5)
WBC: 4.6 10*3/uL (ref 4.0–10.5)

## 2015-06-03 LAB — URINALYSIS, ROUTINE W REFLEX MICROSCOPIC
BILIRUBIN URINE: NEGATIVE
Glucose, UA: NEGATIVE mg/dL
KETONES UR: NEGATIVE mg/dL
NITRITE: NEGATIVE
PH: 6 (ref 5.0–8.0)
Protein, ur: NEGATIVE mg/dL
Specific Gravity, Urine: 1.029 (ref 1.005–1.030)

## 2015-06-03 LAB — URINE MICROSCOPIC-ADD ON

## 2015-06-03 LAB — STREP PNEUMONIAE URINARY ANTIGEN: STREP PNEUMO URINARY ANTIGEN: NEGATIVE

## 2015-06-03 LAB — HIV ANTIBODY (ROUTINE TESTING W REFLEX): HIV SCREEN 4TH GENERATION: NONREACTIVE

## 2015-06-03 LAB — TROPONIN I: Troponin I: <0.03 ng/mL (ref <0.031)

## 2015-06-03 LAB — VITAMIN B12: Vitamin B-12: 298 pg/mL (ref 180–914)

## 2015-06-03 MED ORDER — AMOXICILLIN-POT CLAVULANATE 500-125 MG PO TABS: 1.0000 | ORAL_TABLET | Freq: Two times a day (BID) | ORAL | Status: DC

## 2015-06-03 MED ORDER — ALBUTEROL SULFATE HFA 108 (90 BASE) MCG/ACT IN AERS: 2.0000 | INHALATION_SPRAY | Freq: Four times a day (QID) | RESPIRATORY_TRACT | Status: DC | PRN

## 2015-06-03 MED ORDER — CYANOCOBALAMIN 1000 MCG PO TABS: 1000.0000 ug | ORAL_TABLET | Freq: Every day | ORAL | Status: DC

## 2015-06-03 MED ORDER — AMOXICILLIN-POT CLAVULANATE 500-125 MG PO TABS
1.0000 | ORAL_TABLET | Freq: Two times a day (BID) | ORAL | Status: DC
  Administered 2015-06-03: 500 mg via ORAL
  Filled 2015-06-03 (×2): qty 1

## 2015-06-03 MED ORDER — ENOXAPARIN SODIUM 30 MG/0.3ML ~~LOC~~ SOLN: 30.0000 mg | SUBCUTANEOUS | Status: DC

## 2015-06-03 MED ORDER — CYANOCOBALAMIN 1000 MCG/ML IJ SOLN
1000.0000 ug | Freq: Once | INTRAMUSCULAR | Status: AC
  Administered 2015-06-03: 1000 ug via SUBCUTANEOUS
  Filled 2015-06-03: qty 1

## 2015-06-03 MED ORDER — PREDNISONE 10 MG PO TABS: ORAL_TABLET | ORAL | Status: DC

## 2015-06-03 MED ORDER — LEVOFLOXACIN 750 MG PO TABS: 750.0000 mg | ORAL_TABLET | ORAL | Status: DC

## 2015-06-03 MED ORDER — IPRATROPIUM-ALBUTEROL 0.5-2.5 (3) MG/3ML IN SOLN
3.0000 mL | Freq: Three times a day (TID) | RESPIRATORY_TRACT | Status: DC
  Filled 2015-06-03: qty 3

## 2015-06-03 MED ORDER — TIOTROPIUM BROMIDE MONOHYDRATE 18 MCG IN CAPS: 18.0000 ug | ORAL_CAPSULE | Freq: Every day | RESPIRATORY_TRACT | Status: DC

## 2015-06-03 MED ORDER — VITAMIN B-12 1000 MCG PO TABS: 1000.0000 ug | ORAL_TABLET | Freq: Every day | ORAL | Status: DC

## 2015-06-03 MED ORDER — GUAIFENESIN ER 600 MG PO TB12
600.0000 mg | ORAL_TABLET | Freq: Two times a day (BID) | ORAL | Status: DC
Start: 2015-06-03 — End: 2015-08-19

## 2015-06-03 NOTE — Care Management Note (Signed)
Case Management Note  Patient Details  Name: Terry Valdez MRN: [id number] Date of Birth: 03-05-1921  Subjective/Objective:                 Presents with Community acquired pneumonia with acute on chronic COPD exacerbation. Hx of falls, COPD and hypertension. Resides with wife. Uses walker for ambulation. Owns cane.   Action/Plan: Awaiting PT....... CM to f/u with disposition needs.  Expected Discharge Date:                  Expected Discharge Plan:  Home/Self Care  In-House Referral:     Discharge planning Services  CM Consult  Post Acute Care Choice:    Choice offered to:     DME Arranged:    DME Agency:     HH Arranged:    HH Agency:     Status of Service:  In process, will continue to follow  Medicare Important Message Given:  Yes Date Medicare IM Given:    Medicare IM give by:    Date Additional Medicare IM Given:    Additional Medicare Important Message give by:     If discussed at [place] of Stay Meetings, dates discussed:    Additional Comments: LELAN JANECZEK (Spouse) [phone], Marco Collie (Daughter) Showing  [phone]  [facility] [address] [place], [place] [phone] 06/03/2015, 11:38 AM

## 2015-06-03 NOTE — Discharge Summary (Addendum)
[facility] Discharge Summary   Patient: Terry Valdez [id number]   PCP: Terry Screws, MD DOB: [birth year]   Date of admission: 06/02/2015   Date of discharge: 06/03/2015     Discharge Diagnoses:  Principal Problem:   Community acquired pneumonia Active Problems:   Anemia   Prolonged Q-T interval on ECG   Compression fracture   Elevated d-dimer   Acute exacerbation of COPD with asthma ([place])  Recommendations for Outpatient Follow-up:  1. Please follow-up with PCP in one week milligrams and repeat chest x-ray in 4 weeks  2. Patient has refused home health PT here in the hospital since he has 24-hour care at home  Follow-up Information    Follow up with Terry NEVILL, MD. Schedule an appointment as soon as possible for a visit on 06/16/2015.   Specialty:  Internal Medicine   Why:  Appointment with Dr. Inda Valdez is on 06/16/15 11:30am   Contact information:   [address] [place] [address] [phone]      Diet recommendation: Regular diet  Activity: The patient is advised to gradually reintroduce usual activities.  Discharge Condition: fair  History of present illness: As per the H and P dictated on admission, "Terry Valdez is a 82 y.o. male with medical history significant of COPD and hypertension; who presents with cough and shortness of breath. History obtained from the patient and patient's family member present at bedside. Patient had been seen at his primary care office today with complaints of cough and shortness of breath. Apparently patient had decreased oxygen saturations and there was question of the patient being a Nitrol fibrillation for which EMS was called and he was transported here to the hospital. Patient notes that over the last 3 or 4 days he's had increasing cough with yellow to dark sputum production. Sputum production is thickened nature and has had associated symptoms of shortness of breath, wheezing, and left lower pleuritic  chest pain. Pain on his left lower rib cage is 8 out of 10. Patient was reported to have fallen a few weeks ago, but at baseline ambulates around with the use of a walker. Family is unsure of his last time requiring prednisone exactly, but states it may have been in the last 3 months. Normally patients treated as an outpatient with steroids and antibiotics when he developed similar symptoms. Patient denies any change in urine, fever, chills, chest pain, new rash,change in appetite, focal weakness, or changes in vision. Patient complains of weight loss, but family member notes that his weight has been relatively stable over the last few months averaging anywhere from 90-100 pounds.  ED Course: Upon admission to the emergency department patient was evaluated seen to be afebrile, pulse up to 101, respirations of 38, blood pressure maintained, and O2 saturations greater than 90%. Lab work was relatively unremarkable. Chest x-ray showed a left lower lobe infiltrate. Patient will start on Levaquin for possible pneumonia."  Hospital Course:  Summary of his active problems in the hospital is as following.  Principal Problem: Community acquired pneumonia Left lower lobe pneumonia. Possible mucous plug.  The patient presented with complaints of cough that has been present since last 2 weeks and not improving. Patient was hypoxic as well as tachypneic in the ER. Also complaining of pleuritic chest pain. X ray Rib did not show any evidence of acute fracture. Chest x-ray shows evidence of left basilar airspace opacity. D-dimer was elevated therefore a CT in June of chest PE protocol  was performed which was negative for pulmonary embolism and consistent with left lower lobe pneumonia. Streptococcal antigen was negative. Patient was hematologically stable. Leukocytosis improved as well. Patient was not hypoxic both at rest as well as on ambulation. Family requested the patient to be discharged. Patient was  Terry Levaquin in the hospital but due to prolonged QTC was changed to Augmentin. Finish a 7 day treatment course.   CT scan made a comment regarding opacification of the airway concerning for mucous plug versus endobronchial mass and recommended repeat CT scan in 4 weeks, To ensure resolution. Although this finding is more likely associated with a mucous plug Terry patient's acute presentation.  Active Problems: Compression fracture of thoracic spine. CT scan also found new moderate compression fracture of the mid thoracic spine. Likely this association with patient's chronic cough. No acute pain and patient refused physical therapy evaluation in the hospital. Recommend follow-up with PCP.  Prolonged QT interval on EKG. Avoiding QT prolonging medication. He received Levaquin in the hospital for one dose in the ER, Discharging on Augmentin instead of Levaquin.  Chronic renal insufficiency. At a security stage II. Pharmacy recommended to dose Augmentin at every 12 hours 500 mg because of the renal function. We will finish a total of 7 day treatment course.   Acute exacerbation of COPD with asthma ([place]) Patient will be provided inhaler as well as albuterol inhaler. Mucinex and prednisone is also prescribed.  Essential hypertension. With patient's adequate blood pressure control I would recommend to discontinue hydrochlorothiazide on discharge.  Addendum: Vitamin B-12 deficiency. Vitamin B-12 was 298 which is a relatively low level. Therefore patient will be prescribed oral vitamin B 12 supplementation.  Terry Valdez 4:24 PM 06/03/2015     All other chronic medical condition were stable during the hospitalization.  Patient was seen by physical therapy, and refused home health and mention that he has 24-hour care as well as all the required equipments at home. On the day of the discharge the patient's vitals are stable and patient and family requested to be discharged home, and no  other acute medical condition were reported by patient. the patient was felt safe to be discharge at home with family.  Procedures and Results:  none   Consultations:  none  DISCHARGE MEDICATION: Discharge Medication List as of 06/03/2015  2:21 PM    START taking these medications   Details  albuterol (PROVENTIL HFA;VENTOLIN HFA) 108 (90 Base) MCG/ACT inhaler Inhale 2 puffs into the lungs every 6 (six) hours as needed for wheezing or shortness of breath., Starting 06/03/2015, Until Discontinued, Normal    guaiFENesin (MUCINEX) 600 MG 12 hr tablet Take 1 tablet (600 mg total) by mouth 2 (two) times daily., Starting 06/03/2015, Until Discontinued, Normal    predniSONE (DELTASONE) 10 MG tablet Take 40mg  daily for 3days,Take 30mg  daily for 3days,Take 20mg  daily for 3days,Take 10mg  daily for 3days, then stop., Normal    tiotropium (SPIRIVA HANDIHALER) 18 MCG inhalation capsule Place 1 capsule (18 mcg total) into inhaler and inhale daily., Starting 06/03/2015, Until Discontinued, Normal      CONTINUE these medications which have CHANGED   Details  amoxicillin-clavulanate (AUGMENTIN) 500-125 MG tablet Take 1 tablet (500 mg total) by mouth 2 (two) times daily., Starting 06/03/2015, Until Discontinued, Normal      CONTINUE these medications which have NOT CHANGED   Details  acetaminophen (TYLENOL) 500 MG tablet Take 1,000 mg by mouth at bedtime., Until Discontinued, Historical Med    aspirin EC 81  MG tablet Take 81 mg by mouth daily., Until Discontinued, Historical Med    SYMBICORT 160-4.5 MCG/ACT inhaler INL 2 PFS PO BID, Historical Med      STOP taking these medications     hydrochlorothiazide (HYDRODIURIL) 25 MG tablet        No Known Allergies  Discharge Exam: Filed Weights   06/02/15 1633 06/02/15 2350  Weight: 45.36 kg (100 lb) 43.3 kg (95 lb 7.4 oz)   Filed Vitals:   06/02/15 2350 06/03/15 0528  BP: 98/57 110/48  Pulse: 80 81  Temp: 97.4 F (36.3 C) 97.5 F (36.4 C)    Resp: 42 16   General: Appear in mild distress, no Rash; Oral Mucosa moist. Cardiovascular: S1 and S2 Present, no Murmur, no JVD Respiratory: Bilateral Air entry present and left basal Crackles, no wheezes Abdomen: Bowel Sound present, Soft and no tenderness Extremities: no Pedal edema, no calf tenderness Neurology: Grossly no focal neuro deficit.  The results of significant diagnostics from this hospitalization (including imaging, microbiology, ancillary and laboratory) are listed below for reference.    Significant Diagnostic Studies: Dg Chest 2 View  06/02/2015  CLINICAL DATA:  Acute onset of shortness of breath and cough. Initial encounter. EXAM: CHEST  2 VIEW COMPARISON:  Chest radiograph performed 03/17/2015 FINDINGS: The lungs are hyperexpanded, with flattening of the hemidiaphragms, compatible with COPD. Mild left basilar opacity could reflect pneumonia. There is no evidence of focal opacification, pleural effusion or pneumothorax. The heart is borderline enlarged. No acute osseous abnormalities are seen. IMPRESSION: 1. Mild left basilar opacity raises concern for pneumonia. 2. Findings of COPD. 3. Borderline cardiomegaly. Electronically Signed   By: Garald Balding M.D.   On: 06/02/2015 18:46   Dg Ribs Unilateral Left  06/02/2015  CLINICAL DATA:  Acute onset of shortness of breath and left-sided rib pain. Status post fall 3 weeks ago. Initial encounter. EXAM: LEFT RIBS - 2 VIEW COMPARISON:  Chest radiograph performed 03/17/2015 FINDINGS: No displaced rib fractures are seen. The lungs are hyperexpanded, with flattening of the hemidiaphragms, compatible with COPD. Mild left opacity raises concern for pneumonia. There is no evidence of pleural effusion or pneumothorax. The cardiomediastinal silhouette is within normal limits. No acute osseous abnormalities are seen. IMPRESSION: 1. Mild left basilar airspace opacity raises concern for pneumonia. 2. No displaced rib fracture seen. 3. Findings of  COPD. Electronically Signed   By: Garald Balding M.D.   On: 06/02/2015 18:49   Ct Angio Chest Pe W/cm &/or Wo Cm  06/02/2015  CLINICAL DATA:  Shortness of breath and cough with elevated D-dimer. EXAM: CT ANGIOGRAPHY CHEST WITH CONTRAST TECHNIQUE: Multidetector CT imaging of the chest was performed using the standard protocol during bolus administration of intravenous contrast. Multiplanar CT image reconstructions and MIPs were obtained to evaluate the vascular anatomy. CONTRAST:  80 mL Isovue 370 IV COMPARISON:  CT 08/28/2013 and 12/16/2009 as well as chest x-ray 06/02/2015 FINDINGS: Lungs are well inflated with mild diffuse emphysematous disease. There is mild biapical scarring unchanged. Few small calcified granulomas centrally. There is an airspace process over left lower lobe with small left effusion likely pneumonia. Subtle opacification posterior medial right base which may be due to atelectasis or infection. There is complete opacification over left lower lobe airways extending to the region of the airspace consolidation. This may be due to mucous plugging versus aspiration. There is mild cardiomegaly. There is no evidence of pulmonary embolism. Calcified plaque is present over left main and 3 vessel coronary arteries.  There is calcified plaque over thoracic aorta. There is no mediastinal or hilar adenopathy. Remaining mediastinal structures are unremarkable. Pectus excavatum deformity is present. Images through upper abdomen demonstrate calcified plaque over abdominal aorta. Absence of the left kidney. There are degenerative changes of the spine. There are a 2 adjacent moderate compression fractures over the mid thoracic spine with a mild compression fracture over the lower thoracic spine. The 2 new since 08/28/2013. The mild lower thoracic spine compression fractures new compared to a chest x-ray 03/17/2015. Review of the MIP images confirms the above findings. IMPRESSION: No evidence of pulmonary  emboli. Airspace process over the posterior left lower lobe compatible with pneumonia. Small left effusion. Opacification over the airways in the left lower lobe likely mucous plugging versus aspiration and less likely endobronchial mass. Recommend follow-up CT in 4 weeks. Mild sinus disease. Mild cardiomegaly with atherosclerotic coronary artery disease. Two adjacent moderate compression fractures over the mid to upper thoracic spine unchanged from 03/17/2015 with new mild compression deformity over the lower thoracic spine since 03/17/2015. Electronically Signed   By: Marin Olp M.D.   On: 06/02/2015 21:47    Microbiology: No results found for this or any previous visit (from the past 240 hour(s)).   Labs: CBC:  Recent Labs Lab 06/02/15 1536 06/03/15 0032  WBC 6.6 4.6  NEUTROABS  --  4.1  HGB 12.5* 11.2*  HCT 39.6 36.1*  MCV 101.8* 100.8*  PLT 177 0000000   Basic Metabolic Panel:  Recent Labs Lab 06/02/15 1536 06/03/15 0032  NA 138 138  K 3.6 4.0  CL 105 105  CO2 24 23  GLUCOSE 84 127*  BUN 13 11  CREATININE 0.81 0.79  CALCIUM 9.0 8.8*   Cardiac Enzymes:  Recent Labs Lab 06/03/15 0025  TROPONINI <0.03   BNP (last 3 results)  Recent Labs  06/02/15 1758  BNP 239.5*   CBG: No results for input(s): GLUCAP in the last 168 hours. Time spent: 30 minutes  Signed:  Berle Mull  [facility] 06/03/2015 , 4:12 PM

## 2015-06-03 NOTE — Progress Notes (Signed)
PT Note Pt has all equipment and 24 hour care.  Declines HHPT.  Family in room and they state he is at his baseline and they care for him.  Will Sign off.  Thanks.  [place] [phone] (pager)

## 2015-06-03 NOTE — Care Management Important Message (Signed)
Important Message  Patient Details  Name: Terry Valdez MRN: [id number] Date of Birth: 21-Jul-1921   Medicare Important Message Given:  Yes    Sharin Mons, RN 06/03/2015, 11:20 AM

## 2015-06-03 NOTE — Evaluation (Signed)
Physical Therapy Evaluation and D/C Patient Details Name: Terry Valdez MRN: [id number] DOB: Jun 22, 1921 Today's Date: 06/03/2015   History of Present Illness  Admit with PNA.   Clinical Impression  Pt admitted with above diagnosis. Pt currently without significant  functional limitations and is at baseline per pt and family.  No further PT needs at this time.  Pt and family decline HHPT safety eval.  Pt has 24 hour care.  D/C PT.    Follow Up Recommendations No PT follow up (HHPT safety eval declined by pt and family)    Equipment Recommendations  None recommended by PT    Recommendations for Other Services       Precautions / Restrictions Precautions Precautions: Fall Restrictions Weight Bearing Restrictions: No      Mobility  Bed Mobility Overal bed mobility: Needs Assistance Bed Mobility: Supine to Sit     Supine to sit: Min assist     General bed mobility comments: incr time and he reached for his family's hand to pull up  Transfers Overall transfer level: Needs assistance Equipment used: Rolling walker (2 wheeled) Transfers: Sit to/from Stand Sit to Stand: Supervision         General transfer comment: Pt able to perform sit to stand without physical assist.   Ambulation/Gait Ambulation/Gait assistance: Min guard;Supervision Ambulation Distance (Feet): 150 Feet Assistive device: Rolling walker (2 wheeled) Gait Pattern/deviations: Step-through pattern;Decreased stride length;Trunk flexed;Drifts right/left   Gait velocity interpretation: Below normal speed for age/gender General Gait Details: Pt walks behind RW but has a rollator.  Flexed at trunk and hips but family states pt is ambulating at his baseline.  No LOB with RW.    Stairs            Wheelchair Mobility    Modified Rankin (Stroke Patients Only)       Balance Overall balance assessment: Needs assistance;History of Falls Sitting-balance support: No upper extremity supported;Feet  supported Sitting balance-Leahy Scale: Good     Standing balance support: Bilateral upper extremity supported;During functional activity Standing balance-Leahy Scale: Poor Standing balance comment: relies on UE support for balance                 Standardized Balance Assessment Standardized Balance Assessment : Dynamic Gait Index   Dynamic Gait Index Level Surface: Normal Change in Gait Speed: Normal Gait with Horizontal Head Turns: Mild Impairment Gait with Vertical Head Turns: Mild Impairment Gait and Pivot Turn: Mild Impairment Step Over Obstacle: Mild Impairment Step Around Obstacles: Normal Steps: Moderate Impairment Total Score: 18       Pertinent Vitals/Pain Pain Assessment: No/denies pain  VSS    Home Living Family/patient expects to be discharged to:: Private residence Living Arrangements: Spouse/significant other Available Help at Discharge: Family;Available 24 hours/day Type of Home: House Home Access: Level entry     Home Layout: One level Home Equipment: Walker - 4 wheels;Cane - single point;Grab bars - tub/shower;Hand held shower head;Shower seat Additional Comments: family states pt used cane outdoors and RW indoors.    Prior Function Level of Independence: Independent with assistive device(s)         Comments: I with ADL/s per family.  Two falls in the last few months but overall pt steady per family     Hand Dominance        Extremity/Trunk Assessment   Upper Extremity Assessment: Defer to OT evaluation           Lower Extremity Assessment: Generalized weakness  Cervical / Trunk Assessment: Kyphotic  Communication   Communication: HOH  Cognition Arousal/Alertness: Awake/alert Behavior During Therapy: WFL for tasks assessed/performed Overall Cognitive Status: Within Functional Limits for tasks assessed                      General Comments General comments (skin integrity, edema, etc.): 18/24 suggesting at  risk of falls without device.  Pt uses device at all times at home.    Exercises        Assessment/Plan    PT Assessment Patent does not need any further PT services  PT Diagnosis Generalized weakness   PT Problem List    PT Treatment Interventions     PT Goals (Current goals can be found in the Care Plan section) Acute Rehab PT Goals Patient Stated Goal: to go home today PT Goal Formulation: All assessment and education complete, DC therapy    Frequency     Barriers to discharge        Co-evaluation               End of Session Equipment Utilized During Treatment: Gait belt Activity Tolerance: Patient limited by fatigue Patient left: with call bell/phone within reach;in bed;with family/visitor present Nurse Communication: Mobility status    Functional Assessment Tool Used: clinical judgment Functional Limitation: Mobility: Walking and moving around Mobility: Walking and Moving Around Current Status [id number]): At least 1 percent but less than 20 percent impaired, limited or restricted Mobility: Walking and Moving Around Goal Status [phone]): At least 1 percent but less than 20 percent impaired, limited or restricted Mobility: Walking and Moving Around Discharge Status [phone]): At least 1 percent but less than 20 percent impaired, limited or restricted    Time: 1205-1224 PT Time Calculation (min) (ACUTE ONLY): 19 min   Charges:   PT Evaluation $PT Eval Moderate Complexity: 1 Procedure     PT G Codes:   PT G-Codes **NOT FOR INPATIENT CLASS** Functional Assessment Tool Used: clinical judgment Functional Limitation: Mobility: Walking and moving around Mobility: Walking and Moving Around Current Status [id number]): At least 1 percent but less than 20 percent impaired, limited or restricted Mobility: Walking and Moving Around Goal Status [phone]): At least 1 percent but less than 20 percent impaired, limited or restricted Mobility: Walking and Moving Around Discharge  Status [phone]): At least 1 percent but less than 20 percent impaired, limited or restricted    Denice Paradise 06/03/2015, 2:45 PM Rainey Rodger,PT Acute Rehabilitation [phone] (pager)

## 2015-06-03 NOTE — Progress Notes (Signed)
Nsg Discharge Note  Admit Date:  06/02/2015 Discharge date: 06/03/2015   Stclair Cassandria Santee to be D/C'd home per MD order.  AVS completed.  Copy for chart, and copy for patient, Pt daughter [facility]) signed and dated AVS. Patient and Healthcare power attorney able to verbalize understanding.  Discharge Medication:   Medication List    STOP taking these medications        hydrochlorothiazide 25 MG tablet  Commonly known as:  HYDRODIURIL      TAKE these medications        acetaminophen 500 MG tablet  Commonly known as:  TYLENOL  Take 1,000 mg by mouth at bedtime.     albuterol 108 (90 Base) MCG/ACT inhaler  Commonly known as:  PROVENTIL HFA;VENTOLIN HFA  Inhale 2 puffs into the lungs every 6 (six) hours as needed for wheezing or shortness of breath.     amoxicillin-clavulanate 500-125 MG tablet  Commonly known as:  AUGMENTIN  Take 1 tablet (500 mg total) by mouth 2 (two) times daily.     aspirin EC 81 MG tablet  Take 81 mg by mouth daily.     guaiFENesin 600 MG 12 hr tablet  Commonly known as:  MUCINEX  Take 1 tablet (600 mg total) by mouth 2 (two) times daily.     predniSONE 10 MG tablet  Commonly known as:  DELTASONE  Take 40mg  daily for 3days,Take 30mg  daily for 3days,Take 20mg  daily for 3days,Take 10mg  daily for 3days, then stop.     SYMBICORT 160-4.5 MCG/ACT inhaler  Generic drug:  budesonide-formoterol  INL 2 PFS PO BID     tiotropium 18 MCG inhalation capsule  Commonly known as:  SPIRIVA HANDIHALER  Place 1 capsule (18 mcg total) into inhaler and inhale daily.        Discharge Assessment: Filed Vitals:   06/02/15 2350 06/03/15 0528  BP: 98/57 110/48  Pulse: 80 81  Temp: 97.4 F (36.3 C) 97.5 F (36.4 C)  Resp: 42 16   Skin clean, dry and intact without evidence of skin break down, no evidence of skin tears noted. IV catheter discontinued with catheter tips intact. Site without signs and symptoms of complications - no redness or edema noted at insertion site,  patient denies c/o pain - only slight tenderness at site.  Dressing with slight pressure applied.  D/c Instructions-Education: Discharge instructions given to patient/family (Daughters) with verbalized understanding. D/c education completed with patient/family (Daughters) including follow up instructions, medication list, d/c activities limitations if indicated, with other d/c instructions as indicated by MD - patient able to verbalize understanding, all questions fully answered. Patient instructed to return to ED, call 911, or call MD for any changes in condition.  Patient and family members to call out for assistance with wheelchair when ready for discharge out of building. Family members verbalized understanding. Daughter in room helping pt get dressed at this time. Will continue to monitor pt.   Dorita Fray, RN 06/03/2015 2:38 PM

## 2015-06-03 NOTE — Progress Notes (Signed)
RN ambulated with pt in the hallway with walker.   Patient Saturations on Room Air at Rest = 97%  Patient Saturations on [employer] while Ambulating =89-98%  Dorita Fray 06/03/2015 1:05 PM

## 2015-06-04 LAB — FOLATE RBC
FOLATE, HEMOLYSATE: 479 ng/mL
Folate, RBC: 1447 ng/mL (ref >498)
HEMATOCRIT: 33.1 % — AB (ref 37.5–51.0)

## 2015-06-04 LAB — LEGIONELLA PNEUMOPHILA SEROGP 1 UR AG: L. PNEUMOPHILA SEROGP 1 UR AG: NEGATIVE

## 2015-06-08 LAB — CULTURE, BLOOD (ROUTINE X 2)
CULTURE: NO GROWTH
Culture: NO GROWTH

## 2015-08-03 ENCOUNTER — Other Ambulatory Visit: Payer: Self-pay | Admitting: Physical Medicine and Rehabilitation

## 2015-08-03 DIAGNOSIS — M5442 Lumbago with sciatica, left side: Secondary | ICD-10-CM

## 2015-08-11 ENCOUNTER — Other Ambulatory Visit: Payer: Medicare Other

## 2015-08-14 ENCOUNTER — Inpatient Hospital Stay (HOSPITAL_COMMUNITY)
Admission: EM | Admit: 2015-08-14 | Discharge: 2015-09-11 | DRG: 199 | Disposition: E | Payer: Medicare Other | Attending: General Surgery | Admitting: General Surgery

## 2015-08-14 ENCOUNTER — Emergency Department (HOSPITAL_COMMUNITY): Payer: Medicare Other

## 2015-08-14 ENCOUNTER — Encounter (HOSPITAL_COMMUNITY): Payer: Self-pay | Admitting: Emergency Medicine

## 2015-08-14 DIAGNOSIS — R1319 Other dysphagia: Secondary | ICD-10-CM | POA: Diagnosis present

## 2015-08-14 DIAGNOSIS — Z79899 Other long term (current) drug therapy: Secondary | ICD-10-CM

## 2015-08-14 DIAGNOSIS — Z7982 Long term (current) use of aspirin: Secondary | ICD-10-CM

## 2015-08-14 DIAGNOSIS — J939 Pneumothorax, unspecified: Secondary | ICD-10-CM

## 2015-08-14 DIAGNOSIS — S2241XA Multiple fractures of ribs, right side, initial encounter for closed fracture: Secondary | ICD-10-CM | POA: Diagnosis present

## 2015-08-14 DIAGNOSIS — Z7951 Long term (current) use of inhaled steroids: Secondary | ICD-10-CM

## 2015-08-14 DIAGNOSIS — S2231XA Fracture of one rib, right side, initial encounter for closed fracture: Secondary | ICD-10-CM

## 2015-08-14 DIAGNOSIS — J9601 Acute respiratory failure with hypoxia: Secondary | ICD-10-CM | POA: Diagnosis not present

## 2015-08-14 DIAGNOSIS — Z9181 History of falling: Secondary | ICD-10-CM

## 2015-08-14 DIAGNOSIS — Z515 Encounter for palliative care: Secondary | ICD-10-CM | POA: Diagnosis not present

## 2015-08-14 DIAGNOSIS — J9383 Other pneumothorax: Secondary | ICD-10-CM

## 2015-08-14 DIAGNOSIS — J440 Chronic obstructive pulmonary disease with acute lower respiratory infection: Secondary | ICD-10-CM | POA: Diagnosis present

## 2015-08-14 DIAGNOSIS — W0110XA Fall on same level from slipping, tripping and stumbling with subsequent striking against unspecified object, initial encounter: Secondary | ICD-10-CM | POA: Diagnosis present

## 2015-08-14 DIAGNOSIS — R1310 Dysphagia, unspecified: Secondary | ICD-10-CM | POA: Diagnosis not present

## 2015-08-14 DIAGNOSIS — R636 Underweight: Secondary | ICD-10-CM | POA: Diagnosis present

## 2015-08-14 DIAGNOSIS — S270XXA Traumatic pneumothorax, initial encounter: Secondary | ICD-10-CM | POA: Diagnosis present

## 2015-08-14 DIAGNOSIS — Z66 Do not resuscitate: Secondary | ICD-10-CM | POA: Diagnosis present

## 2015-08-14 DIAGNOSIS — J449 Chronic obstructive pulmonary disease, unspecified: Secondary | ICD-10-CM | POA: Diagnosis present

## 2015-08-14 DIAGNOSIS — R410 Disorientation, unspecified: Secondary | ICD-10-CM | POA: Diagnosis present

## 2015-08-14 DIAGNOSIS — Z681 Body mass index (BMI) 19 or less, adult: Secondary | ICD-10-CM

## 2015-08-14 DIAGNOSIS — J811 Chronic pulmonary edema: Secondary | ICD-10-CM | POA: Diagnosis present

## 2015-08-14 DIAGNOSIS — S272XXA Traumatic hemopneumothorax, initial encounter: Secondary | ICD-10-CM

## 2015-08-14 DIAGNOSIS — J441 Chronic obstructive pulmonary disease with (acute) exacerbation: Secondary | ICD-10-CM | POA: Diagnosis present

## 2015-08-14 DIAGNOSIS — R0902 Hypoxemia: Secondary | ICD-10-CM

## 2015-08-14 DIAGNOSIS — J9382 Other air leak: Secondary | ICD-10-CM | POA: Diagnosis not present

## 2015-08-14 DIAGNOSIS — J690 Pneumonitis due to inhalation of food and vomit: Secondary | ICD-10-CM | POA: Diagnosis not present

## 2015-08-14 DIAGNOSIS — J189 Pneumonia, unspecified organism: Secondary | ICD-10-CM | POA: Diagnosis not present

## 2015-08-14 DIAGNOSIS — F17210 Nicotine dependence, cigarettes, uncomplicated: Secondary | ICD-10-CM | POA: Diagnosis present

## 2015-08-14 DIAGNOSIS — J42 Unspecified chronic bronchitis: Secondary | ICD-10-CM | POA: Diagnosis not present

## 2015-08-14 DIAGNOSIS — L8990 Pressure ulcer of unspecified site, unspecified stage: Secondary | ICD-10-CM | POA: Insufficient documentation

## 2015-08-14 LAB — COMPREHENSIVE METABOLIC PANEL
ALBUMIN: 3.9 g/dL (ref 3.5–5.0)
ALT: 24 U/L (ref 17–63)
ANION GAP: 13 (ref 5–15)
AST: 40 U/L (ref 15–41)
Alkaline Phosphatase: 104 U/L (ref 38–126)
BUN: 19 mg/dL (ref 6–20)
CALCIUM: 9.5 mg/dL (ref 8.9–10.3)
CHLORIDE: 100 mmol/L — AB (ref 101–111)
CO2: 27 mmol/L (ref 22–32)
Creatinine, Ser: 0.96 mg/dL (ref 0.61–1.24)
GFR calc non Af Amer: >60 mL/min (ref >60)
Glucose, Bld: 99 mg/dL (ref 65–99)
POTASSIUM: 3.5 mmol/L (ref 3.5–5.1)
SODIUM: 140 mmol/L (ref 135–145)
Total Bilirubin: 0.9 mg/dL (ref 0.3–1.2)
Total Protein: 7.2 g/dL (ref 6.5–8.1)

## 2015-08-14 LAB — CBC WITH DIFFERENTIAL/PLATELET
BASOS PCT: 0 %
Basophils Absolute: 0 10*3/uL (ref 0.0–0.1)
EOS ABS: 0 10*3/uL (ref 0.0–0.7)
EOS PCT: 0 %
HCT: 41.7 % (ref 39.0–52.0)
Hemoglobin: 13.2 g/dL (ref 13.0–17.0)
LYMPHS ABS: 0.5 10*3/uL — AB (ref 0.7–4.0)
Lymphocytes Relative: 4 %
MCH: 33.6 pg (ref 26.0–34.0)
MCHC: 31.7 g/dL (ref 30.0–36.0)
MCV: 106.1 fL — ABNORMAL HIGH (ref 78.0–100.0)
MONOS PCT: 3 %
Monocytes Absolute: 0.5 10*3/uL (ref 0.1–1.0)
Neutro Abs: 13.6 10*3/uL — ABNORMAL HIGH (ref 1.7–7.7)
Neutrophils Relative %: 93 %
PLATELETS: 247 10*3/uL (ref 150–400)
RBC: 3.93 MIL/uL — ABNORMAL LOW (ref 4.22–5.81)
RDW: 14 % (ref 11.5–15.5)
WBC: 14.6 10*3/uL — AB (ref 4.0–10.5)

## 2015-08-14 LAB — I-STAT VENOUS BLOOD GAS, ED
Acid-Base Excess: 2 mmol/L (ref 0.0–2.0)
Bicarbonate: 28.3 meq/L — ABNORMAL HIGH (ref 20.0–24.0)
O2 Saturation: 46 %
TCO2: 30 mmol/L (ref 0–100)
pCO2, Ven: 49.1 mmHg (ref 45.0–50.0)
pH, Ven: 7.368 — ABNORMAL HIGH (ref 7.250–7.300)
pO2, Ven: 26 mmHg — ABNORMAL LOW (ref 31.0–45.0)

## 2015-08-14 LAB — I-STAT TROPONIN, ED: TROPONIN I, POC: 0.07 ng/mL (ref 0.00–0.08)

## 2015-08-14 LAB — MRSA PCR SCREENING: MRSA by PCR: NEGATIVE

## 2015-08-14 LAB — BRAIN NATRIURETIC PEPTIDE: B Natriuretic Peptide: 647 pg/mL — ABNORMAL HIGH (ref 0.0–100.0)

## 2015-08-14 LAB — I-STAT CG4 LACTIC ACID, ED: LACTIC ACID, VENOUS: 1.99 mmol/L — AB (ref 0.5–1.9)

## 2015-08-14 LAB — TROPONIN I: TROPONIN I: 0.08 ng/mL — AB (ref <0.03)

## 2015-08-14 MED ORDER — PANTOPRAZOLE SODIUM 40 MG PO TBEC
40.0000 mg | DELAYED_RELEASE_TABLET | Freq: Every day | ORAL | Status: DC
  Administered 2015-08-14 – 2015-08-15 (×2): 40 mg via ORAL
  Filled 2015-08-14 (×2): qty 1

## 2015-08-14 MED ORDER — KCL IN DEXTROSE-NACL 20-5-0.45 MEQ/L-%-% IV SOLN
INTRAVENOUS | Status: DC
  Administered 2015-08-14: 18:00:00 via INTRAVENOUS
  Administered 2015-08-15: 50 mL/h via INTRAVENOUS
  Filled 2015-08-14 (×4): qty 1000

## 2015-08-14 MED ORDER — IPRATROPIUM BROMIDE 0.02 % IN SOLN
1.0000 mg | Freq: Once | RESPIRATORY_TRACT | Status: AC
  Administered 2015-08-14: 1 mg via RESPIRATORY_TRACT
  Filled 2015-08-14: qty 5

## 2015-08-14 MED ORDER — AMOXICILLIN-POT CLAVULANATE 875-125 MG PO TABS
1.0000 | ORAL_TABLET | Freq: Two times a day (BID) | ORAL | Status: DC
  Administered 2015-08-14 – 2015-08-15 (×3): 1 via ORAL
  Filled 2015-08-14 (×5): qty 1

## 2015-08-14 MED ORDER — ASPIRIN EC 81 MG PO TBEC
81.0000 mg | DELAYED_RELEASE_TABLET | Freq: Every day | ORAL | Status: DC
  Administered 2015-08-15: 81 mg via ORAL
  Filled 2015-08-14 (×2): qty 1

## 2015-08-14 MED ORDER — METHYLPREDNISOLONE SODIUM SUCC 125 MG IJ SOLR
125.0000 mg | Freq: Once | INTRAMUSCULAR | Status: AC
  Administered 2015-08-14: 125 mg via INTRAVENOUS
  Filled 2015-08-14: qty 2

## 2015-08-14 MED ORDER — FENTANYL CITRATE (PF) 100 MCG/2ML IJ SOLN
25.0000 ug | Freq: Once | INTRAMUSCULAR | Status: AC
  Administered 2015-08-14: 25 ug via INTRAVENOUS

## 2015-08-14 MED ORDER — TIOTROPIUM BROMIDE MONOHYDRATE 18 MCG IN CAPS
18.0000 ug | ORAL_CAPSULE | Freq: Every day | RESPIRATORY_TRACT | Status: DC
  Administered 2015-08-15: 18 ug via RESPIRATORY_TRACT
  Filled 2015-08-14: qty 5

## 2015-08-14 MED ORDER — MOMETASONE FURO-FORMOTEROL FUM 200-5 MCG/ACT IN AERO
2.0000 | INHALATION_SPRAY | Freq: Two times a day (BID) | RESPIRATORY_TRACT | Status: DC
  Administered 2015-08-14 – 2015-08-16 (×4): 2 via RESPIRATORY_TRACT
  Filled 2015-08-14 (×17): qty 8.8

## 2015-08-14 MED ORDER — POLYETHYLENE GLYCOL 3350 17 G PO PACK
17.0000 g | PACK | Freq: Every day | ORAL | Status: DC
  Administered 2015-08-15: 17 g via ORAL
  Filled 2015-08-14: qty 1

## 2015-08-14 MED ORDER — ALBUTEROL (5 MG/ML) CONTINUOUS INHALATION SOLN: 2.5000 mg/h | INHALATION_SOLUTION | Freq: Four times a day (QID) | RESPIRATORY_TRACT | Status: DC

## 2015-08-14 MED ORDER — GUAIFENESIN ER 600 MG PO TB12
600.0000 mg | ORAL_TABLET | Freq: Two times a day (BID) | ORAL | Status: DC
  Administered 2015-08-15: 600 mg via ORAL
  Filled 2015-08-14 (×2): qty 1

## 2015-08-14 MED ORDER — ONDANSETRON HCL 4 MG PO TABS: 4.0000 mg | ORAL_TABLET | Freq: Four times a day (QID) | ORAL | Status: DC | PRN

## 2015-08-14 MED ORDER — PREDNISONE 20 MG PO TABS
40.0000 mg | ORAL_TABLET | Freq: Every day | ORAL | Status: DC
  Administered 2015-08-15: 40 mg via ORAL
  Filled 2015-08-14 (×2): qty 2

## 2015-08-14 MED ORDER — LIDOCAINE-EPINEPHRINE (PF) 2 %-1:200000 IJ SOLN
10.0000 mL | Freq: Once | INTRAMUSCULAR | Status: DC
  Filled 2015-08-14: qty 20

## 2015-08-14 MED ORDER — ONDANSETRON HCL 4 MG/2ML IJ SOLN: 4.0000 mg | Freq: Four times a day (QID) | INTRAMUSCULAR | Status: DC | PRN

## 2015-08-14 MED ORDER — DOCUSATE SODIUM 100 MG PO CAPS
100.0000 mg | ORAL_CAPSULE | Freq: Two times a day (BID) | ORAL | Status: DC
Start: 2015-08-14 — End: 2015-08-16
  Administered 2015-08-15: 100 mg via ORAL
  Filled 2015-08-14: qty 1

## 2015-08-14 MED ORDER — TRAMADOL HCL 50 MG PO TABS: 50.0000 mg | ORAL_TABLET | Freq: Four times a day (QID) | ORAL | Status: DC | PRN

## 2015-08-14 MED ORDER — ENOXAPARIN SODIUM 40 MG/0.4ML ~~LOC~~ SOLN
40.0000 mg | SUBCUTANEOUS | Status: DC
  Administered 2015-08-14 – 2015-08-16 (×3): 40 mg via SUBCUTANEOUS
  Filled 2015-08-14 (×3): qty 0.4

## 2015-08-14 MED ORDER — ALBUTEROL (5 MG/ML) CONTINUOUS INHALATION SOLN: 5.0000 mg/h | INHALATION_SOLUTION | Freq: Four times a day (QID) | RESPIRATORY_TRACT | Status: DC

## 2015-08-14 MED ORDER — FENTANYL CITRATE (PF) 100 MCG/2ML IJ SOLN
INTRAMUSCULAR | Status: AC
  Filled 2015-08-14: qty 2

## 2015-08-14 MED ORDER — MORPHINE SULFATE (PF) 2 MG/ML IV SOLN
2.0000 mg | INTRAVENOUS | Status: DC | PRN
  Administered 2015-08-14: 2 mg via INTRAVENOUS
  Administered 2015-08-16 (×2): 0.5 mg via INTRAVENOUS
  Administered 2015-08-16 – 2015-08-17 (×4): 2 mg via INTRAVENOUS
  Filled 2015-08-14 (×7): qty 1

## 2015-08-14 MED ORDER — ALBUTEROL (5 MG/ML) CONTINUOUS INHALATION SOLN
10.0000 mg/h | INHALATION_SOLUTION | Freq: Once | RESPIRATORY_TRACT | Status: AC
  Administered 2015-08-14: 10 mg/h via RESPIRATORY_TRACT
  Filled 2015-08-14: qty 20

## 2015-08-14 MED ORDER — ALBUTEROL SULFATE (2.5 MG/3ML) 0.083% IN NEBU
2.5000 mg | INHALATION_SOLUTION | Freq: Four times a day (QID) | RESPIRATORY_TRACT | Status: DC
  Administered 2015-08-14 – 2015-08-17 (×9): 2.5 mg via RESPIRATORY_TRACT
  Filled 2015-08-14 (×11): qty 3

## 2015-08-14 NOTE — ED Notes (Addendum)
Pt has hx or COPD but can generally manage it at home per daughter.  He took home inhaler this morning, but his work of breathing increased today at PCP and he was sent to ED.

## 2015-08-14 NOTE — H&P (Signed)
Terry Valdez is an 82 y.o. male.   Chief Complaint: Fall HPI: Terry Valdez tripped and fell in his bathroom today striking his chest. He denied syncope, LOC, or hitting his head. He was evaluated in the ED and found to have several right rib fxs and a large PTX. Trauma service was asked to see pt. History of multiple falls.  Past Medical History:  Diagnosis Date  . Asthma   . Cancer ([place])   . COPD (chronic obstructive pulmonary disease) ([place])     History reviewed. No pertinent surgical history.  No family history on file. Social History:  reports that he has been smoking Cigarettes.  He has a 21.00 pack-year smoking history. He does not have any smokeless tobacco history on file. He reports that he drinks alcohol. His drug history is not on file.  Allergies: No Known Allergies   Results for orders placed or performed during the hospital encounter of 09/06/2015 (from the past 48 hour(s))  CBC with Differential     Status: Abnormal   Collection Time: 08/16/2015  2:34 PM  Result Value Ref Range   WBC 14.6 (H) 4.0 - 10.5 K/uL   RBC 3.93 (L) 4.22 - 5.81 MIL/uL   Hemoglobin 13.2 13.0 - 17.0 g/dL   HCT 41.7 39.0 - 52.0 %   MCV 106.1 (H) 78.0 - 100.0 fL   MCH 33.6 26.0 - 34.0 pg   MCHC 31.7 30.0 - 36.0 g/dL   RDW 14.0 11.5 - 15.5 %   Platelets 247 150 - 400 K/uL   Neutrophils Relative % 93 %   Neutro Abs 13.6 (H) 1.7 - 7.7 K/uL   Lymphocytes Relative 4 %   Lymphs Abs 0.5 (L) 0.7 - 4.0 K/uL   Monocytes Relative 3 %   Monocytes Absolute 0.5 0.1 - 1.0 K/uL   Eosinophils Relative 0 %   Eosinophils Absolute 0.0 0.0 - 0.7 K/uL   Basophils Relative 0 %   Basophils Absolute 0.0 0.0 - 0.1 K/uL  Comprehensive metabolic panel     Status: Abnormal   Collection Time: 09/05/2015  2:34 PM  Result Value Ref Range   Sodium 140 135 - 145 mmol/L   Potassium 3.5 3.5 - 5.1 mmol/L   Chloride 100 (L) 101 - 111 mmol/L   CO2 27 22 - 32 mmol/L   Glucose, Bld 99 65 - 99 mg/dL   BUN 19 6 - 20 mg/dL   Creatinine,  Ser 0.96 0.61 - 1.24 mg/dL   Calcium 9.5 8.9 - 10.3 mg/dL   Total Protein 7.2 6.5 - 8.1 g/dL   Albumin 3.9 3.5 - 5.0 g/dL   AST 40 15 - 41 U/L   ALT 24 17 - 63 U/L   Alkaline Phosphatase 104 38 - 126 U/L   Total Bilirubin 0.9 0.3 - 1.2 mg/dL   GFR calc non Af Amer >60 >60 mL/min   GFR calc Af Amer >60 >60 mL/min    Comment: (NOTE) The eGFR has been calculated using the CKD EPI equation. This calculation has not been validated in all clinical situations. eGFR's persistently <60 mL/min signify possible Chronic Kidney Disease.    Anion gap 13 5 - 15  Troponin I     Status: Abnormal   Collection Time: 08/31/2015  2:34 PM  Result Value Ref Range   Troponin I 0.08 (HH) <0.03 ng/mL    Comment: CRITICAL RESULT CALLED TO, READ BACK BY AND VERIFIED WITH: CHILDRESS,W RN '@1550'$  BY GRINSTEAD,C 8.4.17   I-stat  troponin, ED     Status: None   Collection Time: 08/13/2015  2:42 PM  Result Value Ref Range   Troponin i, poc 0.07 0.00 - 0.08 ng/mL   Comment 3            Comment: Due to the release kinetics of cTnI, a negative result within the first hours of the onset of symptoms does not rule out myocardial infarction with certainty. If myocardial infarction is still suspected, repeat the test at appropriate intervals.   I-Stat CG4 Lactic Acid, ED     Status: Abnormal   Collection Time: 08/20/2015  2:44 PM  Result Value Ref Range   Lactic Acid, Venous 1.99 (HH) 0.5 - 1.9 mmol/L  I-Stat Venous Blood Gas, ED (order at [facility] and [facility] only)     Status: Abnormal   Collection Time: 08/27/2015  2:50 PM  Result Value Ref Range   pH, Ven 7.368 (H) 7.250 - 7.300   pCO2, Ven 49.1 45.0 - 50.0 mmHg   pO2, Ven 26.0 (L) 31.0 - 45.0 mmHg   Bicarbonate 28.3 (H) 20.0 - 24.0 mEq/L   TCO2 30 0 - 100 mmol/L   O2 Saturation 46.0 %   Acid-Base Excess 2.0 0.0 - 2.0 mmol/L   Patient temperature HIDE    Sample type VENOUS    Comment NOTIFIED PHYSICIAN    Dg Chest Portable 1 View  Result Date: 09/07/2015 CLINICAL DATA:   Shortness of breath and productive cough, initial encounter EXAM: PORTABLE CHEST 1 VIEW COMPARISON:  06/02/2015 FINDINGS: Cardiac shadow is within normal limits. Aortic calcifications are seen. The left lung is well aerated and clear. The right lung demonstrates evidence of a large pneumothorax as well as some rib fractures to include the eighth tenth and eleventh ribs. IMPRESSION: Multiple right rib fractures with associated large pneumothorax. Critical Value/emergent results were called by telephone at the time of interpretation on 08/15/2015 at 3:19 pm to Dr. Duffy Bruce , who verbally acknowledged these results. Electronically Signed   By: Inez Catalina M.D.   On: 08/23/2015 15:19    Review of Systems  Unable to perform ROS: Severe respiratory distress    Blood pressure 157/86, pulse 83, temperature 97.7 F (36.5 C), temperature source Oral, resp. rate (!) 33, SpO2 100 %. Physical Exam  Vitals reviewed. Constitutional: He is oriented to person, place, and time. He appears well-developed and well-nourished. He is cooperative. He appears distressed. Nasal cannula in place.  HENT:  Head: Normocephalic and atraumatic. Head is without raccoon's eyes, without Battle's sign, without abrasion, without contusion and without laceration.  Right Ear: Hearing, tympanic membrane, external ear and ear canal normal. No lacerations. No drainage or tenderness. No foreign bodies. Tympanic membrane is not perforated. No hemotympanum.  Left Ear: Hearing, tympanic membrane, external ear and ear canal normal. No lacerations. No drainage or tenderness. No foreign bodies. Tympanic membrane is not perforated. No hemotympanum.  Nose: Nose normal. No nose lacerations, sinus tenderness, nasal deformity or nasal septal hematoma. No epistaxis.  Mouth/Throat: Uvula is midline, oropharynx is clear and moist and mucous membranes are normal. No lacerations. No oropharyngeal exudate.  Eyes: Conjunctivae, EOM and lids are normal.  Pupils are equal, round, and reactive to light. Right eye exhibits no discharge. Left eye exhibits no discharge. No scleral icterus.  Neck: Trachea normal and normal range of motion. Neck supple. No JVD present. No spinous process tenderness and no muscular tenderness present. Carotid bruit is not present. No tracheal deviation present. No thyromegaly  present.  Cardiovascular: Normal rate, regular rhythm, intact distal pulses and normal pulses.  Exam reveals no gallop and no friction rub.   Murmur heard. Respiratory: No stridor. He is in respiratory distress. He has decreased breath sounds in the right upper field. He has no wheezes. He has no rales. He exhibits tenderness. He exhibits no bony tenderness, no laceration and no crepitus.  GI: Soft. Normal appearance. He exhibits no distension. Bowel sounds are decreased. There is no tenderness. There is no rigidity, no rebound, no guarding and no CVA tenderness.    Musculoskeletal: Normal range of motion. He exhibits no edema or tenderness.  Lymphadenopathy:    He has no cervical adenopathy.  Neurological: He is alert and oriented to person, place, and time. He has normal strength. No cranial nerve deficit or sensory deficit. GCS eye subscore is 4. GCS verbal subscore is 5. GCS motor subscore is 6.  Skin: Skin is warm, dry and intact. He is not diaphoretic.  Psychiatric: He has a normal mood and affect. His speech is normal and behavior is normal.     Assessment/Plan Fall Multiple right rib fxs w/HPTX -- CT being placed now. Pain control and pulmonary toilet. Given pt's age and concurrent COPD will put in ICU. Multiple medical problems -- Home meds    Lisette Abu, PA-C Pager: [phone] General Trauma PA Pager: [phone] 08/25/2015, 3:58 PM

## 2015-08-14 NOTE — ED Notes (Signed)
Trauma MD at bedside.

## 2015-08-14 NOTE — ED Notes (Signed)
Respiratory notified of breathing treatment.

## 2015-08-14 NOTE — Procedures (Signed)
Chest Tube Insertion Procedure Note  Pre-operative Diagnosis: Right pneumothorax  Post-operative Diagnosis: Right pneumothorax  Procedure Details  Informed consent was obtained for the procedure, including sedation.  Risks of lung perforation, hemorrhage, arrhythmia, and adverse drug reaction were discussed.   After sterile skin prep, using standard technique, a 20 French tube was placed in the right anterior axillary line, nipple level.  Findings: Rush of air.  Estimated Blood Loss:  Minimal         Specimens:  None              Complications:  None; patient tolerated the procedure well.         Disposition: admit to ICU         Condition: stable  Georganna Skeans, MD, MPH, FACS Trauma: [phone] General Surgery: [phone]

## 2015-08-14 NOTE — ED Notes (Signed)
Dr. Georganna Skeans at bedside to insert Chest tube

## 2015-08-14 NOTE — ED Provider Notes (Signed)
[facility] DEPT Provider Note   CSN: [id number] Arrival date & time: 08/31/2015  1356  First Provider Contact:  None       History   Chief Complaint Chief Complaint  Patient presents with  . Shortness of Breath    HPI Terry Valdez is a 82 y.o. male.  HPI   82 year old male with past medical history of COPD who presents with acute onset shortness of breath. Patient initially reported that he has had several days of worsening shortness of breath. He was seen by his primary care doctor this morning and diagnosed with COPD exacerbation. He was sent here for further evaluation. On arrival, patient is in moderate respiratory distress and unable to provide further history. However, patient's daughter states that the patient fell earlier this morning onto his right side. He has since had acute change in his breathing was significant increased work of breathing.  Remainder of history, ROS, and physical exam limited due to patient's respiratory distress.   Past Medical History:  Diagnosis Date  . Asthma   . Cancer ([place])   . COPD (chronic obstructive pulmonary disease) [facility])     Patient Active Problem List   Diagnosis Date Noted  . Traumatic pneumothorax 09/10/2015  . Anemia 06/03/2015  . Prolonged Q-T interval on ECG 06/03/2015  . Compression fracture 06/03/2015  . Elevated d-dimer 06/03/2015  . Acute exacerbation of COPD with asthma ([place]) 06/03/2015  . Pneumonia 06/02/2015  . Community acquired pneumonia 06/02/2015    History reviewed. No pertinent surgical history.     Home Medications    Prior to Admission medications   Medication Sig Start Date End Date Taking? Authorizing Provider  acetaminophen (TYLENOL) 500 MG tablet Take 1,000 mg by mouth at bedtime.   Yes Historical Provider, MD  albuterol (PROVENTIL HFA;VENTOLIN HFA) 108 (90 Base) MCG/ACT inhaler Inhale 2 puffs into the lungs every 6 (six) hours as needed for wheezing or shortness of breath. 06/03/15  Yes  Lavina Hamman, MD  aspirin EC 81 MG tablet Take 81 mg by mouth daily.   Yes Historical Provider, MD  fentaNYL (DURAGESIC - DOSED MCG/HR) 50 MCG/HR Place 50 mcg onto the skin every 3 (three) days.   Yes Historical Provider, MD  guaiFENesin (MUCINEX) 600 MG 12 hr tablet Take 1 tablet (600 mg total) by mouth 2 (two) times daily. Patient taking differently: Take 600 mg by mouth 2 (two) times daily as needed for cough or to loosen phlegm.  06/03/15  Yes Lavina Hamman, MD  SYMBICORT 160-4.5 MCG/ACT inhaler INL 2 PFS PO BID 05/11/15  Yes Historical Provider, MD  vitamin B-12 1000 MCG tablet Take 1 tablet (1,000 mcg total) by mouth daily. 06/04/15  Yes Lavina Hamman, MD  amoxicillin-clavulanate (AUGMENTIN) 500-125 MG tablet Take 1 tablet (500 mg total) by mouth 2 (two) times daily. Patient not taking: Reported on 09/03/2015 06/03/15   Lavina Hamman, MD  predniSONE (DELTASONE) 10 MG tablet Take 40mg  daily for 3days,Take 30mg  daily for 3days,Take 20mg  daily for 3days,Take 10mg  daily for 3days, then stop. Patient not taking: Reported on 08/31/2015 06/03/15   Lavina Hamman, MD  tiotropium (SPIRIVA HANDIHALER) 18 MCG inhalation capsule Place 1 capsule (18 mcg total) into inhaler and inhale daily. Patient not taking: Reported on 08/18/2015 06/03/15   Lavina Hamman, MD    Family History No family history on file.  Social History Social History  Substance Use Topics  . Smoking status: Current Every Day Smoker  Packs/day: 0.30    Years: 70.00    Types: Cigarettes  . Smokeless tobacco: Not on file  . Alcohol use Yes     Comment: occasional     Allergies   Review of patient's allergies indicates no known allergies.   Review of Systems Review of Systems  Constitutional: Positive for fatigue. Negative for chills and fever.  HENT: Negative for congestion and rhinorrhea.   Eyes: Negative for visual disturbance.  Respiratory: Positive for cough, chest tightness, shortness of breath and wheezing.     Cardiovascular: Negative for chest pain and leg swelling.  Gastrointestinal: Negative for abdominal pain, diarrhea, nausea and vomiting.  Genitourinary: Negative for dysuria and flank pain.  Musculoskeletal: Negative for neck pain and neck stiffness.  Skin: Negative for rash and wound.  Allergic/Immunologic: Negative for immunocompromised state.  Neurological: Negative for syncope, weakness and headaches.     Physical Exam Updated Vital Signs BP (!) 141/64   Pulse 98   Temp 97.7 F (36.5 C) (Oral)   Resp 20   SpO2 97%   Physical Exam  Constitutional: He appears well-developed and well-nourished. He appears distressed.  HENT:  Head: Normocephalic.  Mouth/Throat: Oropharynx is clear and moist. No oropharyngeal exudate.  Eyes: Conjunctivae are normal. Pupils are equal, round, and reactive to light.  Neck: Neck supple.  Cardiovascular: Normal rate, regular rhythm and normal heart sounds.  Exam reveals no friction rub.   No murmur heard. Pulmonary/Chest: Accessory muscle usage present. Tachypnea noted. He has decreased breath sounds in the right upper field, the right middle field, the right lower field and the left lower field. He has wheezes. He has rhonchi in the right lower field and the left lower field.  Abdominal: Soft. He exhibits no distension. There is no tenderness.  Musculoskeletal: He exhibits no edema.  Neurological: He is alert. He exhibits normal muscle tone.  Skin: Skin is warm. Capillary refill takes less than 2 seconds.  Nursing note and vitals reviewed.    ED Treatments / Results  Labs (all labs ordered are listed, but only abnormal results are displayed) Labs Reviewed  CBC WITH DIFFERENTIAL/PLATELET - Abnormal; Notable for the following:       Result Value   WBC 14.6 (*)    RBC 3.93 (*)    MCV 106.1 (*)    Neutro Abs 13.6 (*)    Lymphs Abs 0.5 (*)    All other components within normal limits  COMPREHENSIVE METABOLIC PANEL - Abnormal; Notable for the  following:    Chloride 100 (*)    All other components within normal limits  BRAIN NATRIURETIC PEPTIDE - Abnormal; Notable for the following:    B Natriuretic Peptide 647.0 (*)    All other components within normal limits  TROPONIN I - Abnormal; Notable for the following:    Troponin I 0.08 (*)    All other components within normal limits  I-STAT CG4 LACTIC ACID, ED - Abnormal; Notable for the following:    Lactic Acid, Venous 1.99 (*)    All other components within normal limits  I-STAT VENOUS BLOOD GAS, ED - Abnormal; Notable for the following:    pH, Ven 7.368 (*)    pO2, Ven 26.0 (*)    Bicarbonate 28.3 (*)    All other components within normal limits  MRSA PCR SCREENING  CBC  BASIC METABOLIC PANEL  I-STAT TROPOININ, ED  I-STAT CG4 LACTIC ACID, ED    EKG  EKG Interpretation None  Radiology Dg Chest Portable 1 View  Result Date: 09/06/2015 CLINICAL DATA:  Chest tube insertion. EXAM: PORTABLE CHEST 1 VIEW COMPARISON:  Portable film earlier today. FINDINGS: RIGHT chest tube has been inserted. Marked decrease RIGHT pneumothorax. Lateral component persists, could be 5-10%. IMPRESSION: Improved RIGHT pneumothorax. Electronically Signed   By: Staci Righter M.D.   On: 08/17/2015 16:24   Dg Chest Portable 1 View  Result Date: 09/02/2015 CLINICAL DATA:  Shortness of breath and productive cough, initial encounter EXAM: PORTABLE CHEST 1 VIEW COMPARISON:  06/02/2015 FINDINGS: Cardiac shadow is within normal limits. Aortic calcifications are seen. The left lung is well aerated and clear. The right lung demonstrates evidence of a large pneumothorax as well as some rib fractures to include the eighth tenth and eleventh ribs. IMPRESSION: Multiple right rib fractures with associated large pneumothorax. Critical Value/emergent results were called by telephone at the time of interpretation on 08/27/2015 at 3:19 pm to Dr. Duffy Bruce , who verbally acknowledged these results. Electronically  Signed   By: Inez Catalina M.D.   On: 09/07/2015 15:19    Procedures Procedures (including critical care time)  Medications Ordered in ED Medications  lidocaine-EPINEPHrine (XYLOCAINE W/EPI) 2 %-1:200000 (PF) injection 10 mL (not administered)  fentaNYL (SUBLIMAZE) 100 MCG/2ML injection (not administered)  aspirin EC tablet 81 mg (not administered)  guaiFENesin (MUCINEX) 12 hr tablet 600 mg (not administered)  mometasone-formoterol (DULERA) 200-5 MCG/ACT inhaler 2 puff (not administered)  tiotropium (SPIRIVA) inhalation capsule 18 mcg (not administered)  enoxaparin (LOVENOX) injection 40 mg (not administered)  dextrose 5 % and 0.45 % NaCl with KCl 20 mEq/L infusion (not administered)  docusate sodium (COLACE) capsule 100 mg (not administered)  ondansetron (ZOFRAN) tablet 4 mg (not administered)    Or  ondansetron (ZOFRAN) injection 4 mg (not administered)  pantoprazole (PROTONIX) EC tablet 40 mg (not administered)  polyethylene glycol (MIRALAX / GLYCOLAX) packet 17 g (not administered)  traMADol (ULTRAM) tablet 50-100 mg (not administered)  morphine 2 MG/ML injection 2 mg (2 mg Intravenous Given 09/06/2015 1732)  predniSONE (DELTASONE) tablet 40 mg (not administered)  amoxicillin-clavulanate (AUGMENTIN) 875-125 MG per tablet 1 tablet (not administered)  albuterol (PROVENTIL,VENTOLIN) solution continuous neb (not administered)  methylPREDNISolone sodium succinate (SOLU-MEDROL) 125 mg/2 mL injection 125 mg (125 mg Intravenous Given 08/29/2015 1443)  albuterol (PROVENTIL,VENTOLIN) solution continuous neb (10 mg/hr Nebulization Given 08/22/2015 1534)  ipratropium (ATROVENT) nebulizer solution 1 mg (1 mg Nebulization Given 08/13/2015 1534)  fentaNYL (SUBLIMAZE) injection 25 mcg (25 mcg Intravenous Given 08/31/2015 1554)     Initial Impression / Assessment and Plan / ED Course  I have reviewed the triage vital signs and the nursing notes.  Pertinent labs & imaging results that were available during my care  of the patient were reviewed by me and considered in my medical decision making (see chart for details).  Clinical Course  82 year old male with past medical history of severe COPD who presents with acute onset of worsening shortness of breath in the setting of fall this morning. Patient also has diffuse wheezing consistent with COPD exacerbation. Stat chest x-ray obtained and shows large right-sided pneumothorax with significant lung collapse. At this time, patient has no tachycardia, hypotension, or signs of tension physiology, but will consult trauma surgery for stat chest tube placement. Patient placed on breathing treatment with improvement oxygenation.  Chest tube placed by cardiothoracic surgery. Patient tolerated the procedure well with significantly improved work of breathing. Of note, he does have some rib fractures on that side as  well. Fentanyl given for pain. Will admit to trauma service.  Final Clinical Impressions(s) / ED Diagnoses   Final diagnoses:  Acute pneumothorax  Rib fracture, right, closed, initial encounter  COPD exacerbation [facility])  Hypoxia    New Prescriptions Current Discharge Medication List       Duffy Bruce, MD 08/18/2015 [phone]

## 2015-08-14 NOTE — ED Notes (Signed)
Verbal consent for Chest tube given by Terry Valdez, Daughter and POA

## 2015-08-14 NOTE — ED Notes (Signed)
X-ray at bedside

## 2015-08-14 NOTE — ED Triage Notes (Signed)
Per [facility], pt went to PCP for SOB x1 week, saturations were 86% on RA. Pt placed on 2L [place] by ems, saturations now 95%. Pt has increased WOB.

## 2015-08-14 NOTE — Consult Note (Signed)
Name: Terry Valdez MRN: [id number] DOB: Jan 25, 1921    ADMISSION DATE:  09/03/2015 CONSULTATION DATE:  08/16/2015  REFERRING MD :  Dr Dennis Bast, MD     CHIEF COMPLAINT:  Copd, acute resp failure  BRIEF PATIENT DESCRIPTION:  82 COPD, fall PTX, exacerbation , resp failure  SIGNIFICANT EVENTS  8/4- fall, PTX, chest tube  STUDIES:  pcxr 8/4  large rt PTX  HISTORY OF PRESENT ILLNESS:   82 yr old COPD male who tripped and fell in his bathroom today striking his chest. He denied syncope, LOC, or hitting his head. He was evaluated in the ED and found to have several right rib fxs and a large PTX. Trauma service admitted the pt and placed a chest tube. He continued to have sig wheezing, resp failulre. No fevers, no change sin sputum production or cough.  PAST MEDICAL HISTORY :   has a past medical history of Asthma; Cancer ([place]); and COPD (chronic obstructive pulmonary disease) ([place]).  has no past surgical history on file. Prior to Admission medications   Medication Sig Start Date End Date Taking? Authorizing Provider  acetaminophen (TYLENOL) 500 MG tablet Take 1,000 mg by mouth at bedtime.    Historical Provider, MD  albuterol (PROVENTIL HFA;VENTOLIN HFA) 108 (90 Base) MCG/ACT inhaler Inhale 2 puffs into the lungs every 6 (six) hours as needed for wheezing or shortness of breath. 06/03/15   Lavina Hamman, MD  amoxicillin-clavulanate (AUGMENTIN) 500-125 MG tablet Take 1 tablet (500 mg total) by mouth 2 (two) times daily. 06/03/15   Lavina Hamman, MD  aspirin EC 81 MG tablet Take 81 mg by mouth daily.    Historical Provider, MD  guaiFENesin (MUCINEX) 600 MG 12 hr tablet Take 1 tablet (600 mg total) by mouth 2 (two) times daily. 06/03/15   Lavina Hamman, MD  predniSONE (DELTASONE) 10 MG tablet Take 40mg  daily for 3days,Take 30mg  daily for 3days,Take 20mg  daily for 3days,Take 10mg  daily for 3days, then stop. 06/03/15   Lavina Hamman, MD  SYMBICORT 160-4.5 MCG/ACT inhaler INL 2 PFS PO BID 05/11/15    Historical Provider, MD  tiotropium (SPIRIVA HANDIHALER) 18 MCG inhalation capsule Place 1 capsule (18 mcg total) into inhaler and inhale daily. 06/03/15   Lavina Hamman, MD  vitamin B-12 1000 MCG tablet Take 1 tablet (1,000 mcg total) by mouth daily. 06/04/15   Lavina Hamman, MD   No Known Allergies  FAMILY HISTORY:  family history is not on file. SOCIAL HISTORY:  reports that he has been smoking Cigarettes.  He has a 21.00 pack-year smoking history. He does not have any smokeless tobacco history on file. He reports that he drinks alcohol.  REVIEW OF SYSTEMS:   Unable to elicit from pt, secondary to resp failure Family denies recent acute illness, he has fallen in past No fevers, GI symptoms, eating well per wife of 54 years No LOC no recent syncope He also produced a lot of secretions daily, especially in am   SUBJECTIVE: SOB improved after chest tube  VITAL SIGNS: Temp:  [97.7 F (36.5 C)] 97.7 F (36.5 C) (08/04 1402) Pulse Rate:  [73-99] 80 (08/04 1608) Resp:  [21-34] 33 (08/04 1608) BP: (141-170)/(67-126) 141/79 (08/04 1608) SpO2:  [89 %-100 %] 100 % (08/04 1608)  PHYSICAL EXAMINATION: General:  Moderate distress Neuro:  Alert, int lethargic, non focal, oriented x 3 HEENT:  jvd wnl Cardiovascular:  s1 s2 RRR distant from loud ronchi Lungs:  ronchi bilateral rt  greater left, no active wheezing Abdomen:  Soft, scaffoid, NT, ND Musculoskeletal:  Low lucle mass, cachectic  Skin:  bruising mild chest wall   Recent Labs Lab 08/27/2015 1434  NA 140  K 3.5  CL 100*  CO2 27  BUN 19  CREATININE 0.96  GLUCOSE 99    Recent Labs Lab 08/25/2015 1434  HGB 13.2  HCT 41.7  WBC 14.6*  PLT 247   Dg Chest Portable 1 View  Result Date: 08/15/2015 CLINICAL DATA:  Chest tube insertion. EXAM: PORTABLE CHEST 1 VIEW COMPARISON:  Portable film earlier today. FINDINGS: RIGHT chest tube has been inserted. Marked decrease RIGHT pneumothorax. Lateral component persists, could be  5-10%. IMPRESSION: Improved RIGHT pneumothorax. Electronically Signed   By: Staci Righter M.D.   On: 08/13/2015 16:24   Dg Chest Portable 1 View  Result Date: 09/07/2015 CLINICAL DATA:  Shortness of breath and productive cough, initial encounter EXAM: PORTABLE CHEST 1 VIEW COMPARISON:  06/02/2015 FINDINGS: Cardiac shadow is within normal limits. Aortic calcifications are seen. The left lung is well aerated and clear. The right lung demonstrates evidence of a large pneumothorax as well as some rib fractures to include the eighth tenth and eleventh ribs. IMPRESSION: Multiple right rib fractures with associated large pneumothorax. Critical Value/emergent results were called by telephone at the time of interpretation on 08/17/2015 at 3:19 pm to Dr. Duffy Bruce , who verbally acknowledged these results. Electronically Signed   By: Inez Catalina M.D.   On: 08/18/2015 15:19    ASSESSMENT / PLAN:  Traumatic PTX right COPD / Chronic Bronchitis Acute resp failure Low Muscle mass Malnourished, severe protein calorie R/o aspiration, dysphagia Active smoker DNR/SNI status  Plan: No active wheezing, would hold off on IV steroids, low threshold dhowever Maintain home regimen Symbicort, Spiriva Change albuterol to scheduled q6h at lower 2.5 mg dose for now with qtc in past prolonged Chest tube to suction, lung not all the way expanded yet, repeat pcxr in am Keep NPO, get SLP in am if distress improved Can continue guafenisin  Would accept BIPAP if worsening resp status but I have had extensive discussions with family son, wife and pt ( alert and oriented). We discussed patients current circumstances and organ failures. We also discussed patient's prior wishes under circumstances such as this. Family and pt has decided to NOT perform resuscitation or intubation if arrest or worsening breathing status but to continue current medical support for now.  If he improves we would need dietician to assess protein  supplementation Also will need to be assessed for Home O2 needs pre dc   Lavon Paganini. Titus Mould, MD, [place] Pgr: [facility]  [facility] and [place] Pager: [phone]  08/28/2015, 4:34 PM

## 2015-08-15 ENCOUNTER — Inpatient Hospital Stay (HOSPITAL_COMMUNITY): Payer: Medicare Other

## 2015-08-15 DIAGNOSIS — L8990 Pressure ulcer of unspecified site, unspecified stage: Secondary | ICD-10-CM | POA: Insufficient documentation

## 2015-08-15 DIAGNOSIS — R1310 Dysphagia, unspecified: Secondary | ICD-10-CM

## 2015-08-15 DIAGNOSIS — J42 Unspecified chronic bronchitis: Secondary | ICD-10-CM

## 2015-08-15 LAB — BASIC METABOLIC PANEL
ANION GAP: 14 (ref 5–15)
BUN: 21 mg/dL — ABNORMAL HIGH (ref 6–20)
CALCIUM: 8.8 mg/dL — AB (ref 8.9–10.3)
CO2: 25 mmol/L (ref 22–32)
Chloride: 102 mmol/L (ref 101–111)
Creatinine, Ser: 0.88 mg/dL (ref 0.61–1.24)
Glucose, Bld: 164 mg/dL — ABNORMAL HIGH (ref 65–99)
Potassium: 3.6 mmol/L (ref 3.5–5.1)
SODIUM: 141 mmol/L (ref 135–145)

## 2015-08-15 LAB — CBC
HEMATOCRIT: 36.3 % — AB (ref 39.0–52.0)
Hemoglobin: 11.2 g/dL — ABNORMAL LOW (ref 13.0–17.0)
MCH: 32.7 pg (ref 26.0–34.0)
MCHC: 30.9 g/dL (ref 30.0–36.0)
MCV: 106.1 fL — AB (ref 78.0–100.0)
Platelets: 174 10*3/uL (ref 150–400)
RBC: 3.42 MIL/uL — AB (ref 4.22–5.81)
RDW: 14 % (ref 11.5–15.5)
WBC: 8.3 10*3/uL (ref 4.0–10.5)

## 2015-08-15 MED ORDER — RESOURCE THICKENUP CLEAR PO POWD
ORAL | Status: DC | PRN
  Filled 2015-08-15: qty 125

## 2015-08-15 MED ORDER — ENSURE ENLIVE PO LIQD
237.0000 mL | ORAL | Status: DC
  Administered 2015-08-15: 237 mL via ORAL

## 2015-08-15 MED ORDER — SODIUM CHLORIDE 0.9 % IV BOLUS (SEPSIS)
250.0000 mL | Freq: Once | INTRAVENOUS | Status: AC
  Administered 2015-08-15: 250 mL via INTRAVENOUS

## 2015-08-15 MED ORDER — ADULT MULTIVITAMIN W/MINERALS CH
1.0000 | ORAL_TABLET | Freq: Every day | ORAL | Status: DC
  Administered 2015-08-15: 1 via ORAL
  Filled 2015-08-15: qty 1

## 2015-08-15 NOTE — Evaluation (Signed)
Clinical/Bedside Swallow Evaluation Patient Details  Name: Terry Valdez MRN: [id number] Date of Birth: 06/02/1921  Today's Date: 08/15/2015 Time: SLP Start Time (ACUTE ONLY): [id number] SLP Stop Time (ACUTE ONLY): 0919 SLP Time Calculation (min) (ACUTE ONLY): 21 min  Past Medical History:  Past Medical History:  Diagnosis Date  . Asthma   . Cancer ([place])   . COPD (chronic obstructive pulmonary disease) ([place])    Past Surgical History: History reviewed. No pertinent surgical history. HPI:  82 yr old COPD male whotripped and fell in his bathroom today striking his chest. He denied syncope, LOC, or hitting his head. He was evaluated in the ED and found to have several right rib fxs and a large PTX. Trauma service admitted the pt and placed a chest tube. He continued to have sig wheezing, resp failulre. No fevers, no change sin sputum production or cough.  Most recent chest x-ray showing small lateral RIGHT basilar pneumothorax with chest tube in place and small RIGHT effusion and rib fractures.  No previous ST work up for swallowing is noted despite family report that the patient has had trouble swallowing for a long time.     Assessment / Plan / Recommendation Clinical Impression  Clinical swallowing evaluation was completed utilizing thin liquids via tsp and cups sips, nectar thick liquids via cup sips, pureed material and dry solids.  Nursing reported that the patient struggled to take his oral meds this AM with liquids.  In addition, the family reported a long history of difficulty swallowing although he has never had a formal evaluation.  Oral mechanism exam was completed and unremarkable.  The patient presented with mild oral and pharyngeal dysphagia characterized by delayed oral transit for dry solids and delayed swallow trigger across textures.  Immediate throat clear was seen given self fed cup sips of thin liquids.   Recommend a regular diet with nectar thick liquids.  ST to follow up for  therapuetic diet tolerance and possible need for objective measure.  MD please advance diet and tolerated.      Aspiration Risk  Mild aspiration risk    Diet Recommendation  Regular with nectar thick liquids.  Medication Administration: Crushed with puree    Other  Recommendations Oral Care Recommendations: Oral care BID Other Recommendations: Order thickener from pharmacy;Prohibited food (jello, ice cream, thin soups);Remove water pitcher;Have oral suction available   Follow up Recommendations   (TBD)    Frequency and Duration min 2x/week  2 weeks       Prognosis Prognosis for Safe Diet Advancement: Fair      Swallow Study   General Date of Onset: 08/20/2015 HPI: 82 yr old COPD male whotripped and fell in his bathroom today striking his chest. He denied syncope, LOC, or hitting his head. He was evaluated in the ED and found to have several right rib fxs and a large PTX. Trauma service admitted the pt and placed a chest tube. He continued to have sig wheezing, resp failulre. No fevers, no change sin sputum production or cough.  Most recent chest x-ray showing small lateral RIGHT basilar pneumothorax with chest tube in place and small RIGHT effusion and rib fractures.  No previous ST work up for swallowing is noted despite family report that the patient has had trouble swallowing for a long time.   Type of Study: Bedside Swallow Evaluation Previous Swallow Assessment: None noted.   Diet Prior to this Study:  (Clears) Temperature Spikes Noted: No Respiratory Status: Nasal cannula History of  Recent Intubation: No Behavior/Cognition: Alert;Cooperative;Pleasant mood Oral Cavity Assessment: Dry Oral Care Completed by SLP: No Oral Cavity - Dentition: Missing dentition Vision: Functional for self-feeding Self-Feeding Abilities: Able to feed self Patient Positioning: Upright in bed Baseline Vocal Quality: Hoarse Volitional Cough: Strong Volitional Swallow: Able to elicit     Oral/Motor/Sensory Function Overall Oral Motor/Sensory Function: Within functional limits   Ice Chips Ice chips: Within functional limits Presentation: Spoon   Thin Liquid Thin Liquid: Impaired Presentation: Cup;Spoon;Self Fed Pharyngeal  Phase Impairments: Suspected delayed Swallow;Throat Clearing - Immediate    Nectar Thick Nectar Thick Liquid: Impaired Presentation: Cup;Self Fed Pharyngeal Phase Impairments: Suspected delayed Swallow   Honey Thick Honey Thick Liquid: Not tested   Puree Puree: Impaired Presentation: Spoon;Self Fed Pharyngeal Phase Impairments: Suspected delayed Swallow   Solid   GO   Solid: Impaired Presentation: Self Fed Oral Phase Impairments: Impaired mastication Oral Phase Functional Implications: Prolonged oral transit Pharyngeal Phase Impairments: Suspected delayed Swallow    Functional Assessment Tool Used: ASHA NOMS Functional Limitations: Swallowing Swallow Current Status [id number]): At least 20 percent but less than 40 percent impaired, limited or restricted Swallow Goal Status [phone]): At least 20 percent but less than 40 percent impaired, limited or restricted  Shelly Flatten, MA, [place] [phone] Shelly Flatten N 08/15/2015,9:26 AM

## 2015-08-15 NOTE — Progress Notes (Signed)
Initial Nutrition Assessment  DOCUMENTATION CODES:  Underweight  INTERVENTION:  Buttermilk on L and D tray  Ensure Enlive q24 hrs, each supplement provides 350 kcal and 20 grams of protein  Mvi with minerals  NUTRITION DIAGNOSIS:  Increased nutrient needs related to wound healing, maintenance of lean body mass, current medications as evidenced by estimated nutritional requirements for these conditions/treatments  GOAL:  Patient will meet greater than or equal to 90% of their needs  MONITOR:  PO intake, Supplement acceptance, Diet advancement, Labs  REASON FOR ASSESSMENT:  Malnutrition Screening Tool    ASSESSMENT:  82 y/o male PMHx COPD, cancer who presented after tripping and falling at home. He suffered several right rib fxs and a large hemopneumothorax s/p Chest tube placement  Pt assessed today by ST who recommended NT liquids.   Spoke with Pt and son/daughter. Patient is very difficult to understand.   From what family reports, pt was eating 2 meals a day PTA. They felt like pt's overall appetite/intake has decreased in the last year.However, they state his weight has remained stable at 90-95 for the last year. This is consistent with wt history from EMR.   Pt's son was adamant that pt eats whatever he wants and truly was not open to any recommendations. RD assumed son was inferring RD was going to recommend vegetables/fruit, but when RD stated he prefered high calorie/protein foods, son stated that if he wants only vegetables that's what he should get.   When asked what pt wanted, son replied "anything but that shit you are bringing him". Son stepped out shortly after.  Discussed with daughter that while RD agrees that pt should eat whatever he wants, it is also important that he recover from this incident and be able to heal quickly and resume  QOL at home. Daughter was more understanding. We discussed higher protein options. Pt has tried Ensure/Boost in the past. She  stated he would try these if brought, but is unlikely to eat much. It was discovered pt "loves" buttermilk. This will be ordered for pt BID.   Pt denies n/v/d. He is on fentanyl patch at home and reports some constipation. He normally takes miralax. His usually bowel frequency is every other day. Today, pt reports his last BM was 3 days ago. RD offered something such as prune juice, but declined at this time.   NFPE: Age-related sarcopenia, do not feel this is indicative of malnutrition  Meds: Colace, ABx, Prednisone, miralax, ppi  Labs reviewed: BNP: 647, CBG elevated   Recent Labs Lab 08/15/2015 1434 08/15/15 0340  NA 140 141  K 3.5 3.6  CL 100* 102  CO2 27 25  BUN 19 21*  CREATININE 0.96 0.88  CALCIUM 9.5 8.8*  GLUCOSE 99 164*   Diet Order:  DIET DYS 3 Room service appropriate? Yes; Fluid consistency: Nectar Thick  Skin: Dry, skin tear to back, PU stage 2 sacrum, Multiple rib fxs.   Last BM:  Unknown-per pt- 3days ago  Height:  Ht Readings from Last 1 Encounters:  06/02/15 5\' 6"  (1.676 m)   Weight:  Wt Readings from Last 1 Encounters:  06/02/15 95 lb 7.4 oz (43.3 kg)   Additional wt history obtained from [facility] and OSH.  Pt weighed at 94 lbs on 12/27/2014  Wt on nursing board today is 91 lbs (41.26 kg)  Ideal Body Weight:  64.55 kg  BMI:  Using Past documented height and today's wt, Pts Body mass index is 15.4 kg/m.  Estimated Nutritional Needs:  Kcal:  1450-1650 (35-40 kcal/kg bw) Protein:  >61 g (1.5 g/kg bw) Fluid:  >1.2 liters fluid  EDUCATION NEEDS:  No education needs identified at this time  Burtis Junes RD, LDN, [facility] Pager: [phone] 08/15/2015 11:32 AM

## 2015-08-15 NOTE — Progress Notes (Signed)
[facility] Electrolyte Replacement Protocol  Patient Name: Terry Valdez DOB: 1921/11/03 MRN: [id number]  Date of Service  08/15/2015   HPI/Events of Note    Recent Labs Lab 08/22/2015 1434 08/15/15 0340  NA 140 141  K 3.5 3.6  CL 100* 102  CO2 27 25  GLUCOSE 99 164*  BUN 19 21*  CREATININE 0.96 0.88  CALCIUM 9.5 8.8*    CrCl cannot be calculated (Unknown ideal weight.).  Intake/Output      08/04 0701 - 08/05 0700   I.V. 475   Total Intake 475   Net +475       Urine Occurrence 1 x    - I/O DETAILED x24h    Total I/O In: 475 [I.V.:475] Out: -  - I/O THIS SHIFT    ASSESSMENT   eICURN Interventions  Unable to replace using protocol   ASSESSMENT: MAJOR ELECTROLYTE    Terry Valdez 08/15/2015, 6:06 AM

## 2015-08-15 NOTE — Evaluation (Signed)
Physical Therapy Evaluation Patient Details Name: QUANTEL DRISKEL MRN: [id number] DOB: Mar 12, 1921 Today's Date: 08/15/2015   History of Present Illness  82 y.o. male admitted to [facility] on 09/07/2015 for fall with R rib fxs and PTX treated with R chest tube.  Pt with significant PMHx of COPD, asthma, and CA.    Clinical Impression  Pt was able to get OOB to chair without too much distress.  He did have some increased DOE during transfer that rebounded quickly once seated.  O2 via [place] left on during session.  This is the pt's second fall this month, and in his current state, I would not feel safe sending him home.  He would benefit from SNF level rehab at discharge.   PT to follow acutely for deficits listed below.       Follow Up Recommendations SNF    Equipment Recommendations  Wheelchair (measurements PT);Wheelchair cushion (measurements PT)    Recommendations for Other Services   NA    Precautions / Restrictions Precautions Precautions: Fall Precaution Comments: pt reports two falls in the past month.       Mobility  Bed Mobility Overal bed mobility: Needs Assistance Bed Mobility: Supine to Sit     Supine to sit: HOB elevated;Min assist     General bed mobility comments: Min assist to support trunk to get to sitting EOB.   Transfers Overall transfer level: Needs assistance Equipment used: Rolling walker (2 wheeled) Transfers: Sit to/from [facility] Sit to Stand: Mod assist Stand pivot transfers: Mod assist       General transfer comment: Mod assist to support trunk, pt flexed over RW and moving quickly to sit down in chair.  Verbal cues and manual assist to prevent premature landing before squared up to chair.  Uncontrolled descent to sit, too many lines and pt moving too quickly to reach back for armrests to sit.   Ambulation/Gait             General Gait Details: NT today, would need second person for safety/line management.          Balance  Overall balance assessment: Needs assistance Sitting-balance support: Feet supported;Bilateral upper extremity supported Sitting balance-Leahy Scale: Fair     Standing balance support: Bilateral upper extremity supported Standing balance-Leahy Scale: Poor                               Pertinent Vitals/Pain Pain Assessment: No/denies pain    Home Living Family/patient expects to be discharged to:: Private residence Living Arrangements: Spouse/significant other (48 y.o. wife uses a cane) Available Help at Discharge: Personal care attendant (aide of sorts comes 3 days per week for 3-4 hours) Type of Home: House Home Access: Level entry     Home Layout: One level Home Equipment: Walker - 4 wheels;Cane - single point;Grab bars - tub/shower;Hand held shower head;Shower seat Additional Comments: family states pt used cane outdoors and RW indoors.    Prior Function Level of Independence: Needs assistance   Gait / Transfers Assistance Needed: walks with rollator  ADL's / Homemaking Assistance Needed: has a housekeeper come in once a week.         Hand Dominance        Extremity/Trunk Assessment   Upper Extremity Assessment: Defer to OT evaluation           Lower Extremity Assessment: Generalized weakness (4/5 throughout bed level assessment, edematous bil feet)  Cervical / Trunk Assessment: Kyphotic  Communication   Communication: HOH  Cognition Arousal/Alertness: Awake/alert Behavior During Therapy: WFL for tasks assessed/performed Overall Cognitive Status: Within Functional Limits for tasks assessed (not specifically tested)                      General Comments General comments (skin integrity, edema, etc.): Pt with increased DOE with stand pivot to 3/4.  Quickly came back around with seated rest.  O2 sats per monitor not accurate as waveform was flat, so unable to truely say if his O2 sats dropped with increased dyspnea level.  Pt kept on  O2 [place] during transfer.   Pt also seems to be significantly malnourished as he is 5'6 or 7 and only 95 lbs. MD in room now discussing his eating habits PTA.            Assessment/Plan    PT Assessment Patient needs continued PT services  PT Diagnosis Difficulty walking;Abnormality of gait;Acute pain;Generalized weakness   PT Problem List Decreased strength;Decreased activity tolerance;Decreased balance;Decreased mobility;Decreased knowledge of use of DME;Decreased knowledge of precautions;Pain;Cardiopulmonary status limiting activity;Decreased skin integrity  PT Treatment Interventions DME instruction;Gait training;Functional mobility training;Therapeutic activities;Therapeutic exercise;Balance training;Patient/family education   PT Goals (Current goals can be found in the Care Plan section) Acute Rehab PT Goals Patient Stated Goal: to go home and get his therapy at home.  PT Goal Formulation: With patient/family Time For Goal Achievement: 08/29/15 Potential to Achieve Goals: Fair    Frequency Min 3X/week           End of Session Equipment Utilized During Treatment: Gait belt;Oxygen Activity Tolerance: Patient limited by fatigue Patient left: in chair;with call bell/phone within reach;with chair alarm set;with family/visitor present Nurse Communication: Mobility status         Time: [id number] PT Time Calculation (min) (ACUTE ONLY): 43 min   Charges:   PT Evaluation $PT Eval Moderate Complexity: 1 Procedure PT Treatments $Therapeutic Activity: 23-37 mins        Caren Garske B. Angelamarie Avakian, PT, DPT [phone]   08/15/2015, 10:45 AM

## 2015-08-15 NOTE — Progress Notes (Signed)
[facility]  Brief: 82 y/o admitted post fall yesterday, has R pneumothorax, COPD  S:  Breathing has improved post chest tube Notes dysphagia (chronic)  O:  Vitals:   08/15/15 0400 08/15/15 0500 08/15/15 0600 08/15/15 0800  BP: (!) 118/53 (!) 122/57 (!) 120/52   Pulse: 81 88 78   Resp: 14 15 13    Temp: 97.4 F (36.3 C)   97.4 F (36.3 C)  TempSrc: Oral   Oral  SpO2: 99% 91% 97%    2L [place]  Gen: sitting up in bed HEENT: NCAT OP clear PULM: Mild rhonchi, no wheezing, CT in place CV: No clear mgr, S1/S2 WNL GI: BS+, soft, nontender MSK: normal bulk/tone Neuro: A&Ox4  CXR: chest tube in place, small R basilar pneumothorax   Impression/Plan: Pneumothorax> continue chest tube per trauma service, OK by [place] to remove per trauma parameters COPD: not in exacerbation> continue q6 albuterol, continue Dulera and Spiriva Dysphagia> agree with SLP evaluation  DNR  Will see again on Monday unless needed on 8/6  Roselie Awkward, MD [facility] Pager: [phone] Cell: [phone] After 3pm or if no response, call [phone]

## 2015-08-15 NOTE — Progress Notes (Signed)
Patient ID: Terry Valdez, male   DOB: Feb 12, 1921, 82 y.o.   MRN: [id number]  [facility]  Subjective: Patient up in chair, responsive.  Daughter at bedside.  No complaints.  Objective: Vital signs in last 24 hours: Temp:  [97.3 F (36.3 C)-98.2 F (36.8 C)] 97.4 F (36.3 C) (08/05 0800) Pulse Rate:  [73-110] 79 (08/05 1000) Resp:  [12-34] 18 (08/05 1000) BP: (85-170)/(52-126) 133/94 (08/05 1000) SpO2:  [89 %-100 %] 100 % (08/05 1000) FiO2 (%):  [2 %] 2 % (08/05 0728)    Intake/Output from previous day: 08/04 0701 - 08/05 0700 In: 675 [I.V.:675] Out: 384 [Urine:350; Chest Tube:34] Intake/Output this shift: Total I/O In: 150 [I.V.:150] Out: -   Physical Exam: HEENT - sclerae clear, mucous membranes moist Neck - soft Chest - coarse BS bilaterally; right chest tube with small air leak with Valsalva Cor - RRR Abdomen - soft, non-tender  Lab Results:   Recent Labs  09/02/2015 1434 08/15/15 0340  WBC 14.6* 8.3  HGB 13.2 11.2*  HCT 41.7 36.3*  PLT 247 174   BMET  Recent Labs  08/30/2015 1434 08/15/15 0340  NA 140 141  K 3.5 3.6  CL 100* 102  CO2 27 25  GLUCOSE 99 164*  BUN 19 21*  CREATININE 0.96 0.88  CALCIUM 9.5 8.8*   PT/INR No results for input(s): LABPROT, INR in the last 72 hours. Comprehensive Metabolic Panel:    Component Value Date/Time   NA 141 08/15/2015 0340   NA 140 08/29/2015 1434   K 3.6 08/15/2015 0340   K 3.5 08/30/2015 1434   CL 102 08/15/2015 0340   CL 100 (L) 08/15/2015 1434   CO2 25 08/15/2015 0340   CO2 27 09/01/2015 1434   BUN 21 (H) 08/15/2015 0340   BUN 19 08/13/2015 1434   CREATININE 0.88 08/15/2015 0340   CREATININE 0.96 08/18/2015 1434   GLUCOSE 164 (H) 08/15/2015 0340   GLUCOSE 99 08/31/2015 1434   CALCIUM 8.8 (L) 08/15/2015 0340   CALCIUM 9.5 08/17/2015 1434   AST 40 08/31/2015 1434   ALT 24 09/06/2015 1434   ALKPHOS 104 09/07/2015 1434   BILITOT 0.9 08/12/2015 1434   PROT 7.2  08/31/2015 1434   ALBUMIN 3.9 08/13/2015 1434    Studies/Results: Dg Chest Port 1 View  Result Date: 08/15/2015 CLINICAL DATA:  Pt states that he is feeling pretty good this morning with no immediate complaints. Traumatic hemopneumothorax per chart. EXAM: PORTABLE CHEST 1 VIEW COMPARISON:  08/15/2015 FINDINGS: RIGHT chest tube in place. No appreciable pneumothorax headache packed. Small lateral pneumothorax at the RIGHT lung base. RIGHT rib fractures noted. Small RIGHT effusion Cardiac silhouette is stable grew LEFT lung clear. IMPRESSION: 1. No significant change. 2. Small lateral RIGHT basilar pneumothorax with chest tube in place . 3. Small RIGHT effusion and rib fractures. Electronically Signed   By: Suzy Bouchard M.D.   On: 08/15/2015 08:42   Dg Chest Portable 1 View  Result Date: 08/23/2015 CLINICAL DATA:  Chest tube insertion. EXAM: PORTABLE CHEST 1 VIEW COMPARISON:  Portable film earlier today. FINDINGS: RIGHT chest tube has been inserted. Marked decrease RIGHT pneumothorax. Lateral component persists, could be 5-10%. IMPRESSION: Improved RIGHT pneumothorax. Electronically Signed   By: Staci Righter M.D.   On: 09/06/2015 16:24   Dg Chest Portable 1 View  Result Date: 08/16/2015 CLINICAL DATA:  Shortness of breath and productive cough, initial encounter EXAM: PORTABLE CHEST 1 VIEW COMPARISON:  06/02/2015  FINDINGS: Cardiac shadow is within normal limits. Aortic calcifications are seen. The left lung is well aerated and clear. The right lung demonstrates evidence of a large pneumothorax as well as some rib fractures to include the eighth tenth and eleventh ribs. IMPRESSION: Multiple right rib fractures with associated large pneumothorax. Critical Value/emergent results were called by telephone at the time of interpretation on 08/13/2015 at 3:19 pm to Dr. Duffy Bruce , who verbally acknowledged these results. Electronically Signed   By: Inez Catalina M.D.   On: 09/07/2015 15:19    Assessment &  Plans: Fall COPD Multiple right rib fxs w/HPTX  Chest tube to suction - persistent air leak  Will advance diet per speech path recommendations Appreciate CCM assistance  Earnstine Regal, MD, [facility] Office: [place] 08/15/2015

## 2015-08-16 ENCOUNTER — Inpatient Hospital Stay (HOSPITAL_COMMUNITY): Payer: Medicare Other

## 2015-08-16 DIAGNOSIS — J189 Pneumonia, unspecified organism: Secondary | ICD-10-CM

## 2015-08-16 DIAGNOSIS — J449 Chronic obstructive pulmonary disease, unspecified: Secondary | ICD-10-CM

## 2015-08-16 LAB — BLOOD GAS, ARTERIAL
ACID-BASE EXCESS: 2 mmol/L (ref 0.0–2.0)
Bicarbonate: 25.9 mEq/L — ABNORMAL HIGH (ref 20.0–24.0)
DRAWN BY: 276051
FIO2: 0.55
O2 SAT: 96.1 %
PATIENT TEMPERATURE: 98.6
TCO2: 27.1 mmol/L (ref 0–100)
pCO2 arterial: 39.5 mmHg (ref 35.0–45.0)
pH, Arterial: 7.432 (ref 7.350–7.450)
pO2, Arterial: 84.3 mmHg (ref 80.0–100.0)

## 2015-08-16 LAB — CBC WITH DIFFERENTIAL/PLATELET
Basophils Absolute: 0 10*3/uL (ref 0.0–0.1)
Basophils Relative: 0 %
EOS ABS: 0 10*3/uL (ref 0.0–0.7)
Eosinophils Relative: 0 %
HCT: 40.7 % (ref 39.0–52.0)
Hemoglobin: 13.3 g/dL (ref 13.0–17.0)
LYMPHS ABS: 0.6 10*3/uL — AB (ref 0.7–4.0)
Lymphocytes Relative: 4 %
MCH: 33.3 pg (ref 26.0–34.0)
MCHC: 32.7 g/dL (ref 30.0–36.0)
MCV: 102 fL — ABNORMAL HIGH (ref 78.0–100.0)
MONO ABS: 0.8 10*3/uL (ref 0.1–1.0)
Monocytes Relative: 5 %
NEUTROS PCT: 91 %
Neutro Abs: 14.3 10*3/uL — ABNORMAL HIGH (ref 1.7–7.7)
PLATELETS: 202 10*3/uL (ref 150–400)
RBC: 3.99 MIL/uL — AB (ref 4.22–5.81)
RDW: 13.9 % (ref 11.5–15.5)
WBC: 15.7 10*3/uL — AB (ref 4.0–10.5)

## 2015-08-16 LAB — BASIC METABOLIC PANEL
ANION GAP: 10 (ref 5–15)
BUN: 15 mg/dL (ref 6–20)
CALCIUM: 8.2 mg/dL — AB (ref 8.9–10.3)
CO2: 25 mmol/L (ref 22–32)
Chloride: 99 mmol/L — ABNORMAL LOW (ref 101–111)
Creatinine, Ser: 0.72 mg/dL (ref 0.61–1.24)
GFR calc Af Amer: >60 mL/min (ref >60)
GFR calc non Af Amer: >60 mL/min (ref >60)
GLUCOSE: 112 mg/dL — AB (ref 65–99)
POTASSIUM: 3.8 mmol/L (ref 3.5–5.1)
SODIUM: 134 mmol/L — AB (ref 135–145)

## 2015-08-16 MED ORDER — FENTANYL 50 MCG/HR TD PT72
50.0000 ug | MEDICATED_PATCH | TRANSDERMAL | Status: DC
  Administered 2015-08-16: 50 ug via TRANSDERMAL
  Filled 2015-08-16: qty 1

## 2015-08-16 MED ORDER — LORAZEPAM 2 MG/ML IJ SOLN
0.5000 mg | INTRAMUSCULAR | Status: DC | PRN
  Administered 2015-08-16: 0.5 mg via INTRAVENOUS
  Filled 2015-08-16: qty 1

## 2015-08-16 MED ORDER — CETYLPYRIDINIUM CHLORIDE 0.05 % MT LIQD
7.0000 mL | Freq: Two times a day (BID) | OROMUCOSAL | Status: DC
  Administered 2015-08-16 – 2015-08-18 (×5): 7 mL via OROMUCOSAL

## 2015-08-16 MED ORDER — SODIUM CHLORIDE 0.9 % IV SOLN
1.5000 g | Freq: Three times a day (TID) | INTRAVENOUS | Status: DC
  Administered 2015-08-16 – 2015-08-19 (×8): 1.5 g via INTRAVENOUS
  Filled 2015-08-16 (×12): qty 1.5

## 2015-08-16 MED ORDER — FUROSEMIDE 10 MG/ML IJ SOLN
20.0000 mg | Freq: Once | INTRAMUSCULAR | Status: AC
  Administered 2015-08-16: 20 mg via INTRAVENOUS
  Filled 2015-08-16: qty 2

## 2015-08-16 NOTE — Progress Notes (Signed)
Pharmacy Antibiotic Note  Coleman DEZMON TORONTO is a 82 y.o. male admitted on 08/30/2015 with pneumonia.  Pharmacy has been consulted for unasyn dosing.  Plan: Unasyn 1.5 g q8h     Temp (24hrs), Avg:97.8 F (36.6 C), Min:97.5 F (36.4 C), Marilyn:98.4 F (36.9 C)   Recent Labs Lab 09/06/2015 1434 09/09/2015 1444 08/15/15 0340  WBC 14.6*  --  8.3  CREATININE 0.96  --  0.88  LATICACIDVEN  --  1.99*  --     CrCl cannot be calculated (Unknown ideal weight.).    No Known Allergies  Levester Fresh, PharmD, BCPS, [facility] Clinical Pharmacist Pager [phone] 08/16/2015 10:29 AM

## 2015-08-16 NOTE — Progress Notes (Signed)
Pt placed on 55% venti mask due to deatting while sleeping; pt is mouth breathing. BBS coarse crackles, RN notified. sats 96-100% on venti mask. Will continue to monitor.

## 2015-08-16 NOTE — Progress Notes (Signed)
eLink Physician-Brief Progress Note Patient Name: Terry Valdez DOB: [birth year] MRN: [id number]   Date of Service  08/16/2015  HPI/Events of Note  Patient had home fentanyl patch in place and is to expire today.  Patient has been fairly comfortable with patch and not oversedate.  eICU Interventions  Continue 50 mcg fentanyl patch every 72 hrs     Intervention Category Intermediate Interventions: Pain - evaluation and management  Mauri Brooklyn, P 08/16/2015, 7:25 PM

## 2015-08-16 NOTE — Progress Notes (Signed)
Follow up - Trauma and Critical Care  Patient Details:    Terry Valdez is an 82 y.o. male.  Lines/tubes : Chest Tube Right 20 Fr. (Active)  Suction -40 cm H2O 08/15/2015  8:00 PM  Chest Tube Air Leak None 08/15/2015  8:00 PM  Drainage Description Serosanguineous 08/15/2015  8:00 PM  Dressing Status Clean;Dry;Intact 08/15/2015  8:00 PM  Dressing Intervention New dressing 08/12/2015  4:02 PM  Site Assessment Clean;Dry;Intact 08/15/2015  4:00 PM  Surrounding Skin Unable to view 08/15/2015  8:00 PM  Output (mL) 62 mL 08/16/2015  6:00 AM     Urostomy Ureterostomy right (Active)  Ostomy Pouch 2 piece 08/15/2015  8:00 PM  Stoma Assessment Pink 08/15/2015  8:00 PM  Peristomal Assessment Intact 08/15/2015  8:00 PM  Output (mL) 350 mL 08/16/2015  6:00 AM    Microbiology/Sepsis markers: Results for orders placed or performed during the hospital encounter of 08/30/2015  MRSA PCR Screening     Status: None   Collection Time: 09/01/2015  5:29 PM  Result Value Ref Range Status   MRSA by PCR NEGATIVE NEGATIVE Final    Comment:        The GeneXpert MRSA Assay (FDA approved for NASAL specimens only), is one component of a comprehensive MRSA colonization surveillance program. It is not intended to diagnose MRSA infection nor to guide or monitor treatment for MRSA infections.     Anti-infectives:  Anti-infectives    Start     Dose/Rate Route Frequency Ordered Stop   08/11/2015 2200  amoxicillin-clavulanate (AUGMENTIN) 875-125 MG per tablet 1 tablet     1 tablet Oral Every 12 hours 08/18/2015 1726        Best Practice/Protocols:  VTE Prophylaxis: Lovenox (prophylaxtic dose) and Mechanical DNR/DNI with chronic COPD  Consults:     Events:  Subjective:    Overnight Issues: Worsened respiratory status, required increased O2 from nasal cannula to venti mask, increased confusion this morning  Objective:  Vital signs for last 24 hours: Temp:  [97.5 F (36.4 C)-98.4 F (36.9 C)] 98.4 F (36.9 C) (08/06  0800) Pulse Rate:  [42-110] 70 (08/06 0600) Resp:  [13-35] 35 (08/06 0600) BP: (99-150)/(45-94) 133/66 (08/06 0600) SpO2:  [90 %-100 %] 100 % (08/06 0600) FiO2 (%):  [55 %] 55 % (08/06 0218)  Hemodynamic parameters for last 24 hours:    Intake/Output from previous day: 08/05 0701 - 08/06 0700 In: 1690 [I.V.:1440; IV Piggyback:250] Out: 704 [Urine:615; Chest Tube:89]  Intake/Output this shift: No intake/output data recorded.  Vent settings for last 24 hours: FiO2 (%):  [55 %] 55 %  Physical Exam:  Gen: calm, no apparent distress HEENT: ventimask in place, no ecchymosis Resp: coarse b/l, crackles heard left base Cardiovascular: RRR Abdomen: soft, NT, ND Ext: ecchymosis b/l arms, no edema Neuro: alert, answers questions with varying accuracy, impulsive  No results found for this or any previous visit (from the past 24 hour(s)).   Assessment/Plan:   NEURO  Acute delirium   Plan: continue reorientation, limit narcotics  PULM  Worsened resp status, PTX from trauma, chronic COPD, "wet sounding" today   Plan: lasix this morning, hold oral nutrition, continue Chest tube at 40cm H20, continue nebulizer/inhalant treatment, follow up ABG  CARDIO  No acute issues   Plan: continue home aspirin  RENAL  pulm edema   Plan: lasix this am, decrease IVF  GI  dysphagia   Plan: hold oral nutrition until resp status improved  ID  On augmentin  from pneumonia at home   Plan: repeat labs now, if worse, broaden abx coverage  HEME  No acute issues   Plan: vte proph  ENDO No acute issues   Plan:   Global Issues   Acute lung injury with advanced chronic COPD, family discussions have occurred, DNR/DNI   LOS: 2 days   Additional comments:I reviewed the patient's new clinical lab test results. ABG with CO2 at 39, O2 84 and I reviewed the patients new imaging test results. chest tube in place, small PTX, some consolidation  Critical Care Total Time*: 30 Minutes  Arta Bruce  Aubriella Perezgarcia 08/16/2015  *Care during the described time interval was provided by me and/or other providers on the critical care team.  I have reviewed this patient's available data, including medical history, events of note, physical examination and test results as part of my evaluation.

## 2015-08-16 NOTE — Progress Notes (Signed)
[facility] PCCM  Brief: 82 y/o admitted post fall yesterday, has R pneumothorax, COPD  S:  Confusion overnight Worsening respiratory status overnight Weak cough  O:  Vitals:   08/16/15 0600 08/16/15 0700 08/16/15 0800 08/16/15 0900  BP: 133/66 (!) 145/83 135/64 (!) 117/101  Pulse: 70 (!) 53 (!) 102 85  Resp: (!) 35 (!) 31 (!) 26 (!) 23  Temp:   98.4 F (36.9 C)   TempSrc:   Axillary   SpO2: 100% 97% 92% 99%   2L Antavion  Gen: lying in bed HEENT: NCAT OP clear PULM: weak cough, no wheezing CV: Loud systolic murmur, [id number] WNL GI: BS+, soft, nontender MSK: normal bulk/tone Neuro: A&Ox4  CXR: chest tube in place, RLL infiltrate   Impression/Plan: Pneumothorax> continue chest tube per trauma service, OK by [place] to remove per trauma parameters COPD: not in exacerbation> continue q6 albuterol, continue Dulera and Spiriva stop systemic steroids Dysphagia> agree with SLP evaluation RLL pneumonia> worrisome considering his advanced age, keep NPO, change to IV antibiotics  DNR  Updated family at length, expressed my concern given his advanced age with pneumonia, risk of death is very high here.  They understand.  Roselie Awkward, MD [place] [facility] Pager: [phone] Cell: [phone] After 3pm or if no response, call [phone]

## 2015-08-17 ENCOUNTER — Inpatient Hospital Stay (HOSPITAL_COMMUNITY): Payer: Medicare Other

## 2015-08-17 DIAGNOSIS — J690 Pneumonitis due to inhalation of food and vomit: Secondary | ICD-10-CM

## 2015-08-17 MED ORDER — SODIUM CHLORIDE 0.9 % IV SOLN
10.0000 mg/h | INTRAVENOUS | Status: DC
  Filled 2015-08-17: qty 10

## 2015-08-17 MED ORDER — MORPHINE SULFATE 25 MG/ML IV SOLN
10.0000 mg/h | INTRAVENOUS | Status: DC
  Administered 2015-08-17: 2 mg/h via INTRAVENOUS
  Filled 2015-08-17: qty 10

## 2015-08-17 MED ORDER — LORAZEPAM 2 MG/ML IJ SOLN
1.0000 mg | INTRAMUSCULAR | Status: DC | PRN
  Administered 2015-08-17: 1 mg via INTRAVENOUS
  Administered 2015-08-18: 20:00:00 via INTRAVENOUS
  Filled 2015-08-17 (×2): qty 1

## 2015-08-17 MED ORDER — ENOXAPARIN SODIUM 30 MG/0.3ML ~~LOC~~ SOLN: 30.0000 mg | Freq: Every day | SUBCUTANEOUS | Status: DC

## 2015-08-17 MED ORDER — MIDAZOLAM BOLUS VIA INFUSION (WITHDRAWAL LIFE SUSTAINING TX)
5.0000 mg | INTRAVENOUS | Status: DC | PRN
  Filled 2015-08-17: qty 20

## 2015-08-17 MED ORDER — MORPHINE BOLUS VIA INFUSION
5.0000 mg | INTRAVENOUS | Status: DC | PRN
  Administered 2015-08-18: 5 mg via INTRAVENOUS
  Filled 2015-08-17: qty 20

## 2015-08-17 NOTE — Progress Notes (Signed)
Speech Language Pathology Treatment:    Patient Details Name: Terry Valdez MRN: [id number] DOB: February 11, 1921 Today's Date: 08/17/2015 Time: 4069-8614 SLP Time Calculation (min) (ACUTE ONLY): 10 min  Assessment / Plan / Recommendation Clinical Impression  RN requested SLP return to assess pt appropriateness for po intake, as pt was requesting something to eat/drink. Family has decided on comfort care. SLP and RN met with family to discuss continuing po intake with known risks of aspiration (choking, breathing difficulty, infection, death). Guidelines to keep in mind include: providing po after oral care, when alert and seated upright, clear voice quality. Family encouraged to keep in mind that if the benefit of providing something to eat or drink outweighs the risk, to proceed carefully, one bite/sip at a time. This information was provided in written form to the family. ST to sign off at this time. Please reconsult if needs arise.    HPI HPI: 82 yr old COPD male whotripped and fell in his bathroom today striking his chest. He denied syncope, LOC, or hitting his head. He was evaluated in the ED and found to have several right rib fxs and a large PTX. Trauma service admitted the pt and placed a chest tube. He continued to have sig wheezing, resp failulre. No fevers, no change sin sputum production or cough.  Most recent chest x-ray showing small lateral RIGHT basilar pneumothorax with chest tube in place and small RIGHT effusion and rib fractures.  No previous ST work up for swallowing is noted despite family report that the patient has had trouble swallowing for a long time.        Celia B. Quentin Ore [facility], CCC-SLP [phone]  Shonna Chock 08/17/2015, 3:17 PM

## 2015-08-17 NOTE — Progress Notes (Addendum)
Patient ID: Terry Valdez, male   DOB: 07/08/1921, 82 y.o.   MRN: [id number]    Subjective: Had increased pain yesterday per daughter - better after fentanyl patch he wears at home re-ordered.  Objective: Vital signs in last 24 hours: Temp:  [97.6 F (36.4 C)-98.4 F (36.9 C)] 97.7 F (36.5 C) (08/07 0400) Pulse Rate:  [68-102] 78 (08/07 0600) Resp:  [17-39] 22 (08/07 0600) BP: (110-144)/(59-108) 131/65 (08/07 0600) SpO2:  [90 %-100 %] 99 % (08/07 0600) FiO2 (%):  [45 %] 45 % (08/06 0800)    Intake/Output from previous day: 08/06 0701 - 08/07 0700 In: 469.9 [I.V.:319.9; IV Piggyback:150] Out: 1440 [Urine:1300; Chest Tube:140] Intake/Output this shift: No intake/output data recorded.  General appearance: no distress Resp: clear to auscultation bilaterally Cardio: regular rate and rhythm GI: soft, NT, ND, +BS Neuro: speech very hard to understand  Lab Results: CBC   Recent Labs  08/15/15 0340 08/16/15 1127  WBC 8.3 15.7*  HGB 11.2* 13.3  HCT 36.3* 40.7  PLT 174 202   BMET  Recent Labs  08/15/15 0340 08/16/15 1127  NA 141 134*  K 3.6 3.8  CL 102 99*  CO2 25 25  GLUCOSE 164* 112*  BUN 21* 15  CREATININE 0.88 0.72  CALCIUM 8.8* 8.2*   PT/INR No results for input(s): LABPROT, INR in the last 72 hours. ABG  Recent Labs  08/20/2015 1450 08/16/15 0801  PHART  --  7.432  HCO3 28.3* 25.9*    Studies/Results: Dg Chest Port 1 View  Result Date: 08/16/2015 CLINICAL DATA:  82 year old male with right-sided pneumothorax. EXAM: PORTABLE CHEST 1 VIEW COMPARISON:  Chest radiograph dated 08/15/2015 FINDINGS: Right-sided chest tube remains in similar position. There is a small right basilar pneumothorax. Patchy areas of airspace opacity predominantly involving the right lower lobe as well as right upper and right middle lobe may represent atelectasis or contusion. Pneumonia is not excluded. The left lung is clear. Stable mild cardiomegaly. Osteopenia with degenerative  changes of the spine. IMPRESSION: Persistent small right basilar pneumothorax. Patchy areas of airspace opacity predominantly involving the right lower lobe may represent atelectasis or contusion. Pneumonia is not excluded. Clinical correlation and follow-up recommended. Electronically Signed   By: Anner Crete M.D.   On: 08/16/2015 05:12    Anti-infectives: Anti-infectives    Start     Dose/Rate Route Frequency Ordered Stop   08/16/15 1200  ampicillin-sulbactam (UNASYN) 1.5 g in sodium chloride 0.9 % 50 mL IVPB     1.5 g 100 mL/hr over 30 Minutes Intravenous Every 8 hours 08/16/15 1028     09/09/2015 2200  amoxicillin-clavulanate (AUGMENTIN) 875-125 MG per tablet 1 tablet  Status:  Discontinued     1 tablet Oral Every 12 hours 08/31/2015 1726 08/16/15 1013      Assessment/Plan: Fall R rib FXs x 3 with HPTX - continue CT to -40 Severe COPD - appreciate pulmonary F/U. Inhalers. Steroids stopped. R PNA - Unasyn IV FEN - f/u Na. Speech eval. VTE - PAS, Lovenox Dispo - DNR/DNI I spoke with his daughter Terry Valdez 08/17/2015  *Care during the described time interval was provided by me and/or other providers on the critical care team.  I have reviewed this patient's available data, including medical history, events of note, physical examination and test results as part of my evaluation.      LOS: 3 days    Georganna Skeans, MD, MPH, FACS Trauma: [phone] General Surgery: [phone]  08/17/2015

## 2015-08-17 NOTE — Progress Notes (Signed)
   08/17/15 1407  Clinical Encounter Type  Visited With Patient and family together;Health care provider  Visit Type Initial;Critical Care;Patient actively dying  Referral From Physician  Spiritual Encounters  Spiritual Needs Grief support  Stress Factors  Family Stress Factors Loss;Health changes   Chaplain met with the patient's wife, two daughters, and son. Patient is going towards comfort care, with chest tube removal happening this afternoon. Chaplain offered support. Chaplain introduced spiritual care services. Spiritual care services available as needed.   Jeri Lager, Chaplain 08/17/15 2:08 PM

## 2015-08-17 NOTE — Progress Notes (Signed)
Patient ID: Terry Valdez, male   DOB: 05-Feb-1921, 82 y.o.   MRN: [id number] I met with his family along with the trauma PA and trauma CSW and his nurse. The family wants to transition to comfort care. Will D/C chest tube and plan transfer to 6N.  There goal will be to arrange home hospice services if he survives another 48h. Georganna Skeans, MD, MPH, FACS Trauma: [phone] General Surgery: [phone]

## 2015-08-17 NOTE — Progress Notes (Signed)
Patient transferred to 6N. Report given to Margarita Grizzle, Therapist, sports. Thayer Ohm D

## 2015-08-17 NOTE — Clinical Social Work Note (Signed)
Clinical Social Worker present for family meeting with MD, RN, and patient adult children.  Patient family has chosen to pursue full comfort care and will have chest tube removed today.  Patient family is agreeable with transition to 6N and if patient stable would like to pursue patient returning home with Hospice following.  Patient family already has a caregiver present for 8 hours a day and will work towards having someone present for the remaining 16 hours.  CSW notified RNCM of patient family wishes for patient to return home with Hospice.  CSW available for support as needed.  Barbette Or, [place]

## 2015-08-17 NOTE — Progress Notes (Signed)
Pt transferred from 24M.oriented to room. Family at bedside. Bed alarm on.

## 2015-08-17 NOTE — Progress Notes (Signed)
SLP Cancellation Note  Patient Details Name: Terry Valdez MRN: [id number] DOB: May 24, 1921   Cancelled treatment:       Reason Eval/Treat Not Completed: Patient not medically ready. Consulted with RN, who reports family making decisions regarding comfort care, and requests hold ST for now. Will continue efforts as appropriate.  Denyse Fillion B. Quentin Ore [facility], CCC-SLP [phone]  Shonna Chock 08/17/2015, 9:24 AM

## 2015-08-17 NOTE — Progress Notes (Signed)
Met with family to discuss home hospice choice.  Per wife and daughters, they are not sure if they want to take pt home with Hospice care, or have pt go to [facility].  Wife states she volunteered at [facility] for 7 years, and is familiar with facility.  Should pt go home with Hospice, they would prefer [facility].  Daughters and wife ask that we reconvene in the morning to discuss this matter further, after seeing how patient does overnight.  Family provided with Hospice and Private Duty Sitter lists, per their request.  Will follow up with patient and family in the AM.    Reinaldo Raddle, RN, BSN  Trauma/Neuro ICU Case Manager [phone]

## 2015-08-17 NOTE — Progress Notes (Signed)
OT Cancellation Note  Patient Details Name: BRADEE HUMBARD MRN: [id number] DOB: 1921-04-03   Cancelled Treatment:    Reason Eval/Treat Not Completed: Patient not medically ready (Dr Grandville Silos request hold at this time) Pt possibly going hospice at this time. OT to hold per MD and Rn request.   Vonita Moss   OTR/L Pager: [phone] Office: [phone] .  08/17/2015, 7:39 AM

## 2015-08-17 NOTE — Progress Notes (Signed)
[facility] PCCM  Brief: 82 y/o admitted post fall 8/4, has R pneumothorax s/p chest tube, COPD  S:  Resting comfortably.  R chest tube without air leak.   O:  Vitals:   08/17/15 0700 08/17/15 0800 08/17/15 0900 08/17/15 1000  BP: 120/69 134/68 128/66 131/63  Pulse: 88 77 70 77  Resp: (!) 26 (!) 23 18 18   Temp:  97.8 F (36.6 C)    TempSrc:  Oral    SpO2: 95% 95% 97% 98%   2L [place]  Gen: frail elderly male, NAD, lying in bed HEENT: NCAT OP clear PULM: resps even non labored on , weak cough, no wheezing CV: Loud systolic murmur, [id number] WNL GI: BS+, soft, nontender MSK: normal bulk/tone Neuro: A&Ox4  CXR: chest tube in place, RLL infiltrate   Impression/Plan: Fall with Rib fx and Pneumothorax> continue chest tube per trauma service, OK by [place] to remove per trauma parameters COPD: not in exacerbation> continue q6 albuterol, continue Dulera and Spiriva. Stable off steroids.  Dysphagia> agree with SLP evaluation  RLL pneumonia> worrisome considering his advanced age, keep NPO, continue abx   DNR/DNI    Nickolas Madrid, NP 08/17/2015  10:43 AM Pager: [phone] or [phone]  Attending Note:  I have examined patient, reviewed labs, studies and notes. I have discussed the case with Shon Millet, and I agree with the data and plans as amended above. Compromised resp status due to poor airway protection, dysphagia, PNA, pneumothorax. Note plans for chest tube removal today, hopefully this will help his cough mechanics. Family aware that he may not recover from this illness. Will continue abx, push pulm hygiene after chest tube out. Please call us if we can assist in any way.   Baltazar Apo, MD, PhD 08/17/2015, 12:15 PM [facility] [phone] or if no answer [phone]

## 2015-08-18 ENCOUNTER — Inpatient Hospital Stay (HOSPITAL_COMMUNITY): Payer: Medicare Other

## 2015-08-18 MED ORDER — ALBUTEROL SULFATE (2.5 MG/3ML) 0.083% IN NEBU: 2.5000 mg | INHALATION_SOLUTION | Freq: Four times a day (QID) | RESPIRATORY_TRACT | Status: DC | PRN

## 2015-08-18 NOTE — Progress Notes (Signed)
No charge note.  After reading the notes it appears family has opted for Hospice.  Discussed with Dr. Grandville Silos and Ellan Lambert.  Will hold off on consultation unless called back by Trauma.  Imogene Burn, [place] [facility] Pager: [phone]

## 2015-08-18 NOTE — Clinical Social Work Note (Signed)
Clinical Social Work Assessment  Patient Details  Name: Terry Valdez MRN: [id number] Date of Birth: [birth year]  Date of referral:  08/18/15               Reason for consult:  Trauma, End of Life/Hospice                Permission sought to share information with:  Family Supports Permission granted to share information::  Yes, Verbal Permission Granted  Name::     Rodner Kummer  Relationship::  Spouse  Contact Information:  [phone]  Housing/Transportation Living arrangements for the past 2 months:  Single Family Home Source of Information:  Adult Children, Spouse Patient Interpreter Needed:  None Criminal Activity/Legal Involvement Pertinent to Current Situation/Hospitalization:  No - Comment as needed Significant Relationships:  Spouse, Adult Children Lives with:  Spouse Do you feel safe going back to the place where you live?  Yes Need for family participation in patient care:  Yes (Comment)  Care giving concerns:  Patient family does not have any concerns at this time.  Family very appreciative for assistance and hope that patient survives long enough to get to [facility].   Social Worker assessment / plan:  Holiday representative received notification from [facility] that patient family has changed their minds about home with hospice and would like to pursue [employer] [facility].  CSW called patient family to confirm and pursued referral to [facility].  Patient wife was a Psychologist, occupational at [employer] for 7 years in the recent years and is very hopeful that patient will survive through the night to get to [facility].  CSW spoke with [facility] who states that patient has a bed available for tomorrow and patient family has already completed paperwork.  CSW spoke with patient spouse and son at bedside to confirm discharge plans for tomorrow pending patient stability.  CSW remains available for support and to facilitate patient discharge needs once medically stable.  Employment  status:  Retired Nurse, adult PT Recommendations:  Not assessed at this time Information / Referral to community resources:  Other (Comment Required) ([facility]idential Hosp[facility])  Patient/Family's Response to care:  Patient family verbalized understanding and appreciation for CSW involvement and support.  Patient family very hopeful for patient to get to [facility] pending his stability through the night.  Patient/Family's Understanding of and Emotional Response to Diagnosis, Current Treatment, and Prognosis:  Patient family appropriately tearful and grieving the inevitable loss of patient.  Patient family very understanding of patient status and overall goal for patient comfort at the end of life.  Emotional Assessment Appearance:  Appears stated age Attitude/Demeanor/Rapport:  Unable to Assess Affect (typically observed):  Unable to Assess Orientation:  Oriented to Self Alcohol / Substance use:  Not Applicable Psych involvement (Current and /or in the community):  No (Comment)  Discharge Needs  Concerns to be addressed:  No discharge needs identified Readmission within the last 30 days:  No Current discharge risk:  Terminally ill Barriers to Discharge:  Continued Medical Work up   [employer], [place]

## 2015-08-18 NOTE — Consult Note (Signed)
[facility] Liaison  Received request from CSW for family interest in [facility]. Chart reviewed and met with family to confirm interest and complete paper work for transfer to [facility] tomorrow if transfer still makes sense. Dr. Donald Hertweck to assume care per family request.    Please fax discharge summary to [phone].  RN please call report to [phone].  Thank you,  Katelan Hirt, LCSW [phone] 

## 2015-08-18 NOTE — Care Management Important Message (Signed)
Important Message  Patient Details  Name: Terry Valdez MRN: [id number] Date of Birth: 10-28-1921   Medicare Important Message Given:  Yes    [name] 08/18/2015, 8:42 AM

## 2015-08-18 NOTE — Progress Notes (Signed)
PT Cancellation Note/Discharge  Patient Details Name: CHANEL HOVEY MRN: [id number] DOB: 10-26-1921   Cancelled Treatment:    Reason Eval/Treat Not Completed: Other (comment).  Pt now full comfort care, and going to [facility].  PT to sign off at this time.  Thanks,    Barbarann Ehlers. [place], [place], DPT [phone]   08/18/2015, 1:54 PM

## 2015-08-18 NOTE — Progress Notes (Signed)
Daughter's FMLA paperwork completed, signed by MD and returned to her.    Reinaldo Raddle, RN, BSN  Trauma/Neuro ICU Case Manager [phone]

## 2015-08-18 NOTE — Progress Notes (Signed)
Nutrition Brief Note  Chart reviewed. Pt now transitioning to comfort care.  No further nutrition interventions warranted at this time.  Please re-consult as needed.   Graycen Sadlon A. Maureena Dabbs, RD, LDN, CDE Pager: [phone] After hours Pager: [phone]  

## 2015-08-18 NOTE — Progress Notes (Signed)
OT Cancellation Note  Patient Details Name: Terry Valdez MRN: [id number] DOB: Feb 25, 1921   Cancelled Treatment:    Reason Eval/Treat Not Completed: Other (comment). Pt has now been transferred to comfort care--OT will sign off.  Almon Register [id number] 08/18/2015, 7:10 AM

## 2015-08-18 NOTE — Clinical Social Work Note (Signed)
Clinical Social Worker received notification from [facility] that patient family wishes are now for [employer] [facility].  CSW has initiated referral with [facility] liaison and awaiting information about potential bed availability.  CSW remains available for support and to facilitate patient discharge needs if deemed appropriate.  Barbette Or, [place]

## 2015-08-18 NOTE — Progress Notes (Addendum)
Patient ID: Terry Valdez, male   DOB: April 26, 1921, 82 y.o.   MRN: [id number]  [facility] Surgery Progress Note     Subjective: Patient asleep lying in bed comfortably. Chest tube pulled yesterday. Daughter reports patient tolerated ice chips yesterday, no intake today. Planning to transfer to [facility] in near future; family met with case manager this a.m. And working on bed placement if stable to transfer.  Objective: Vital signs in last 24 hours: Temp:  [97.6 F (36.4 C)-98.3 F (36.8 C)] 98.1 F (36.7 C) (08/08 0700) Pulse Rate:  [61-107] 88 (08/08 0700) Resp:  [13-31] 13 (08/08 0700) BP: (112-131)/(59-77) 112/66 (08/08 0700) SpO2:  [84 %-100 %] 90 % (08/08 0700)    Intake/Output from previous day: 08/07 0701 - 08/08 0700 In: 314.8 [I.V.:214.8; IV Piggyback:100] Out: 100 [Urine:100] Intake/Output this shift: No intake/output data recorded.  PE: Gen:  Sleeping, NAD Card:  RRR Pulm:  Decreased but present breath sounds on the right, +rhonchi bilaterally Abd: Soft, NT/ND, +BS, no HSM  Lab Results:   Recent Labs  08/16/15 1127  WBC 15.7*  HGB 13.3  HCT 40.7  PLT 202   BMET  Recent Labs  08/16/15 1127  NA 134*  K 3.8  CL 99*  CO2 25  GLUCOSE 112*  BUN 15  CREATININE 0.72  CALCIUM 8.2*   PT/INR No results for input(s): LABPROT, INR in the last 72 hours. CMP     Component Value Date/Time   NA 134 (L) 08/16/2015 1127   K 3.8 08/16/2015 1127   CL 99 (L) 08/16/2015 1127   CO2 25 08/16/2015 1127   GLUCOSE 112 (H) 08/16/2015 1127   BUN 15 08/16/2015 1127   CREATININE 0.72 08/16/2015 1127   CALCIUM 8.2 (L) 08/16/2015 1127   PROT 7.2 08/28/2015 1434   ALBUMIN 3.9 08/21/2015 1434   AST 40 09/03/2015 1434   ALT 24 08/13/2015 1434   ALKPHOS 104 09/09/2015 1434   BILITOT 0.9 08/26/2015 1434   GFRNONAA >60 08/16/2015 1127   GFRAA >60 08/16/2015 1127   Lipase  No results found for: LIPASE     Studies/Results: Dg Chest Port 1 View  Result  Date: 08/17/2015 CLINICAL DATA:  Chest tube.  Cough. EXAM: PORTABLE CHEST 1 VIEW COMPARISON:  08/16/2015. FINDINGS: Right chest tube in stable position. Mediastinum and hilar structures normal. Heart size normal. Right upper lobe and bilateral lower lobe infiltrates again noted. No pleural effusion or pneumothorax. Stable mild right chest wall subcutaneous emphysema. IMPRESSION: 1.  Right chest tube in stable position. 2. Right upper lobe and bilateral lower lobe infiltrates with small bilateral pleural effusions . 3.  Stable cardiomegaly. Electronically Signed   By: Marcello Moores  Register   On: 08/17/2015 08:10    Anti-infectives: Anti-infectives    Start     Dose/Rate Route Frequency Ordered Stop   08/16/15 1200  ampicillin-sulbactam (UNASYN) 1.5 g in sodium chloride 0.9 % 50 mL IVPB     1.5 g 100 mL/hr over 30 Minutes Intravenous Every 8 hours 08/16/15 1028     08/30/2015 2200  amoxicillin-clavulanate (AUGMENTIN) 875-125 MG per tablet 1 tablet  Status:  Discontinued     1 tablet Oral Every 12 hours 09/05/2015 1726 08/16/15 1013       Assessment/Plan Fall R rib FXs x 3 with HPTX - CT removed yesterday as family decided to proceed with palliative care. Working with Almyra Free case Freight forwarder on placement at [facility]. Planning transfer once bed available. Will discuss with  Education officer, museum. Severe COPD - palliative care R PNA - palliative care FEN - palliative care VTE - palliative care Dispo - DNR/DNI   LOS: 4 days    Jerrye Beavers , [facility] Surgery 08/18/2015, 10:21 AM Pager: [phone] Consults: [phone] Mon-Fri 7:00 am-4:30 pm Sat-Sun 7:00 am-11:30 am  Patient examined and I agree with the assessment and plan CSW working on [facility] placement. I spoke with his wife at the bedside.  Georganna Skeans, MD, MPH, FACS Trauma: [phone] General Surgery: [phone]  08/18/2015 2:46 PM

## 2015-09-11 NOTE — Discharge Summary (Signed)
  Terry Valdez Surgery Death Summary   Patient ID: Terry Valdez MRN: [id number] DOB/AGE: 1921/01/19 82 y.o.  Admit date: 08/26/2015 Discharge date: 09-08-15  Admitting Diagnosis: Multiple rib fractures with HPTX Severe COPD  Discharge Diagnosis Patient Active Problem List   Diagnosis Date Noted  . Pressure ulcer 08/15/2015  . Traumatic pneumothorax 09/04/2015  . Anemia 06/03/2015  . Prolonged Q-T interval on ECG 06/03/2015  . Compression fracture 06/03/2015  . Elevated d-dimer 06/03/2015  . Acute exacerbation of COPD with asthma ([place]) 06/03/2015  . Pneumonia 06/02/2015  . Community acquired pneumonia 06/02/2015    Consultants Erling Conte LCSW Imaging: Chest XR 09/02/2015: Multiple right rib fractures with associated large pneumothorax.  Procedures Dr. Grandville Silos (08/16/2015) - chest tube placement  Hospital Course:  Terry Valdez is a 82yo male who presented to [facility] after tripping and falling in his bathroom.  Workup showed multiple right rib fractures with associated large pneumothorax.  Chest tube was placed and patient was admitted to the ICU given age and severe COPD.  He was also found to have pneumonia and therefore started on Unasyn 08/16/15. 08/17/15 patient's family met with Dr. Grandville Silos and after lengthy discussion decided to d/c chest tube and proceed with palliative care. He was transferred to the floor. Planned for d/c to [facility] this afternoon, but the patient was found deceased in his room early this morning.  Physical Exam: General:  Deceased Cardio: no pulse Pulm: no respirations     Medication List    STOP taking these medications   acetaminophen 500 MG tablet Commonly known as:  TYLENOL   albuterol 108 (90 Base) MCG/ACT inhaler Commonly known as:  PROVENTIL HFA;VENTOLIN HFA   amoxicillin-clavulanate 500-125 MG tablet Commonly known as:  AUGMENTIN   aspirin EC 81 MG tablet   cyanocobalamin 1000 MCG tablet   fentaNYL 50 MCG/HR Commonly known as:   DURAGESIC - dosed mcg/hr   guaiFENesin 600 MG 12 hr tablet Commonly known as:  MUCINEX   predniSONE 10 MG tablet Commonly known as:  DELTASONE   SYMBICORT 160-4.5 MCG/ACT inhaler Generic drug:  budesonide-formoterol   tiotropium 18 MCG inhalation capsule Commonly known as:  [place]       Patient passed away early this a.m.   Signed: Jerrye Beavers, [facility] Surgery 09-08-15, 8:08 AM Pager: [phone] Consults: [phone] Mon-Fri 7:00 am-4:30 pm Sat-Sun 7:00 am-11:30 am

## 2015-09-11 NOTE — Progress Notes (Signed)
Wasted 170cc of Morphine in sink.  Witnessed by Alena Bills, RN.

## 2015-09-11 DEATH — deceased

## 2016-12-11 IMAGING — DX DG RIBS 2V*L*
3 series · 3 of 3 positions shown · non-contrast
Comparison: Chest radiograph performed 03/17/2015

CLINICAL DATA: Acute onset of shortness of breath and left-sided
rib pain. Status post fall 3 weeks ago. Initial encounter.

EXAM:
LEFT RIBS - 2 VIEW

[rib pa]
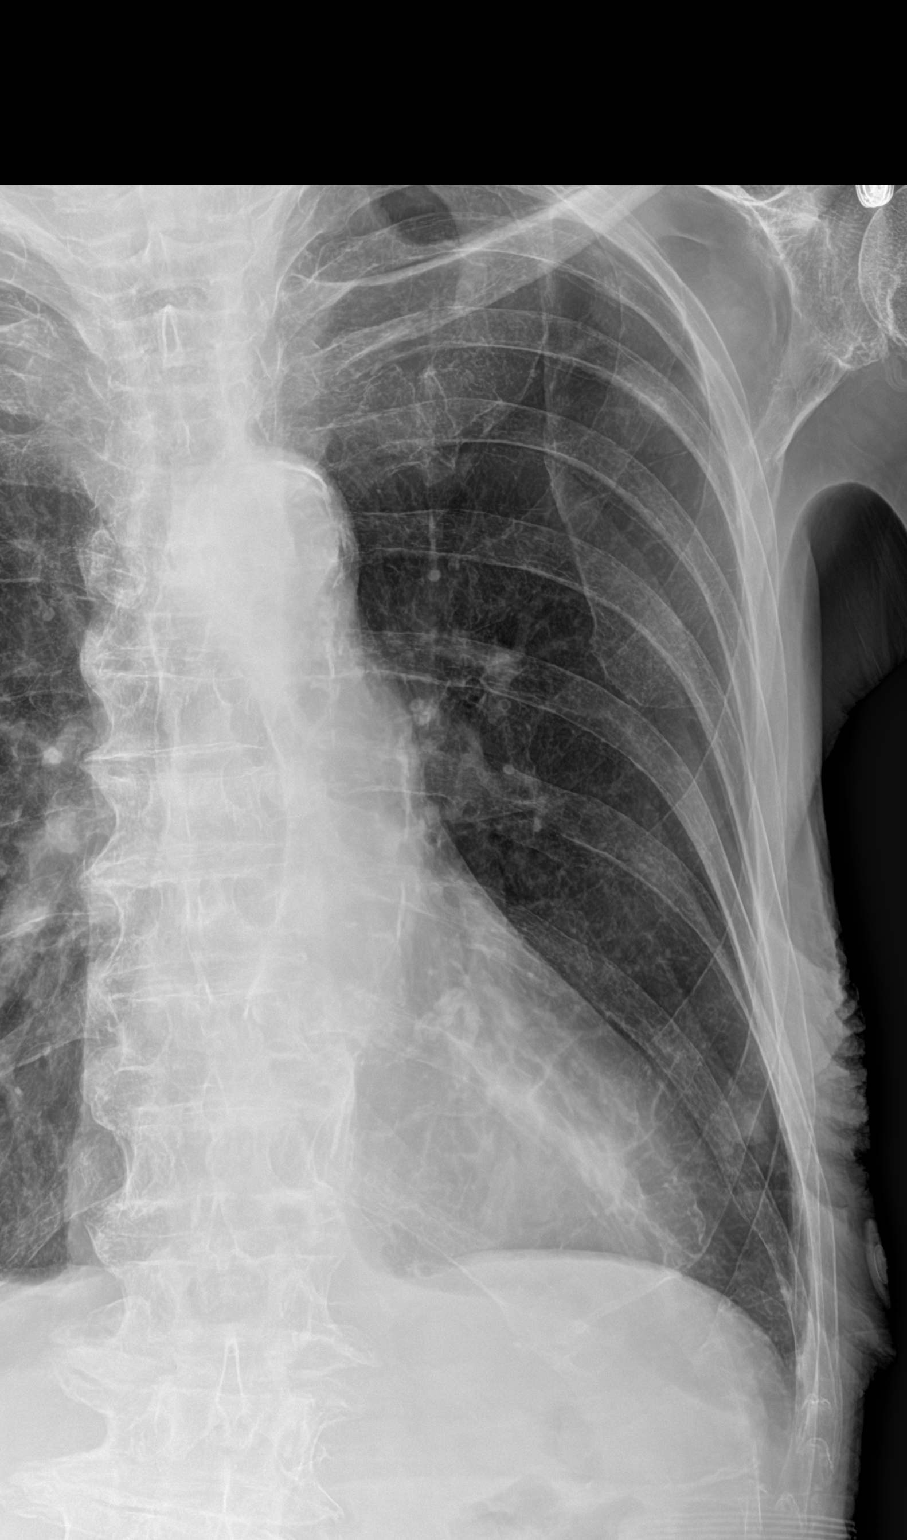

[rib pa obl (1 of 2)]
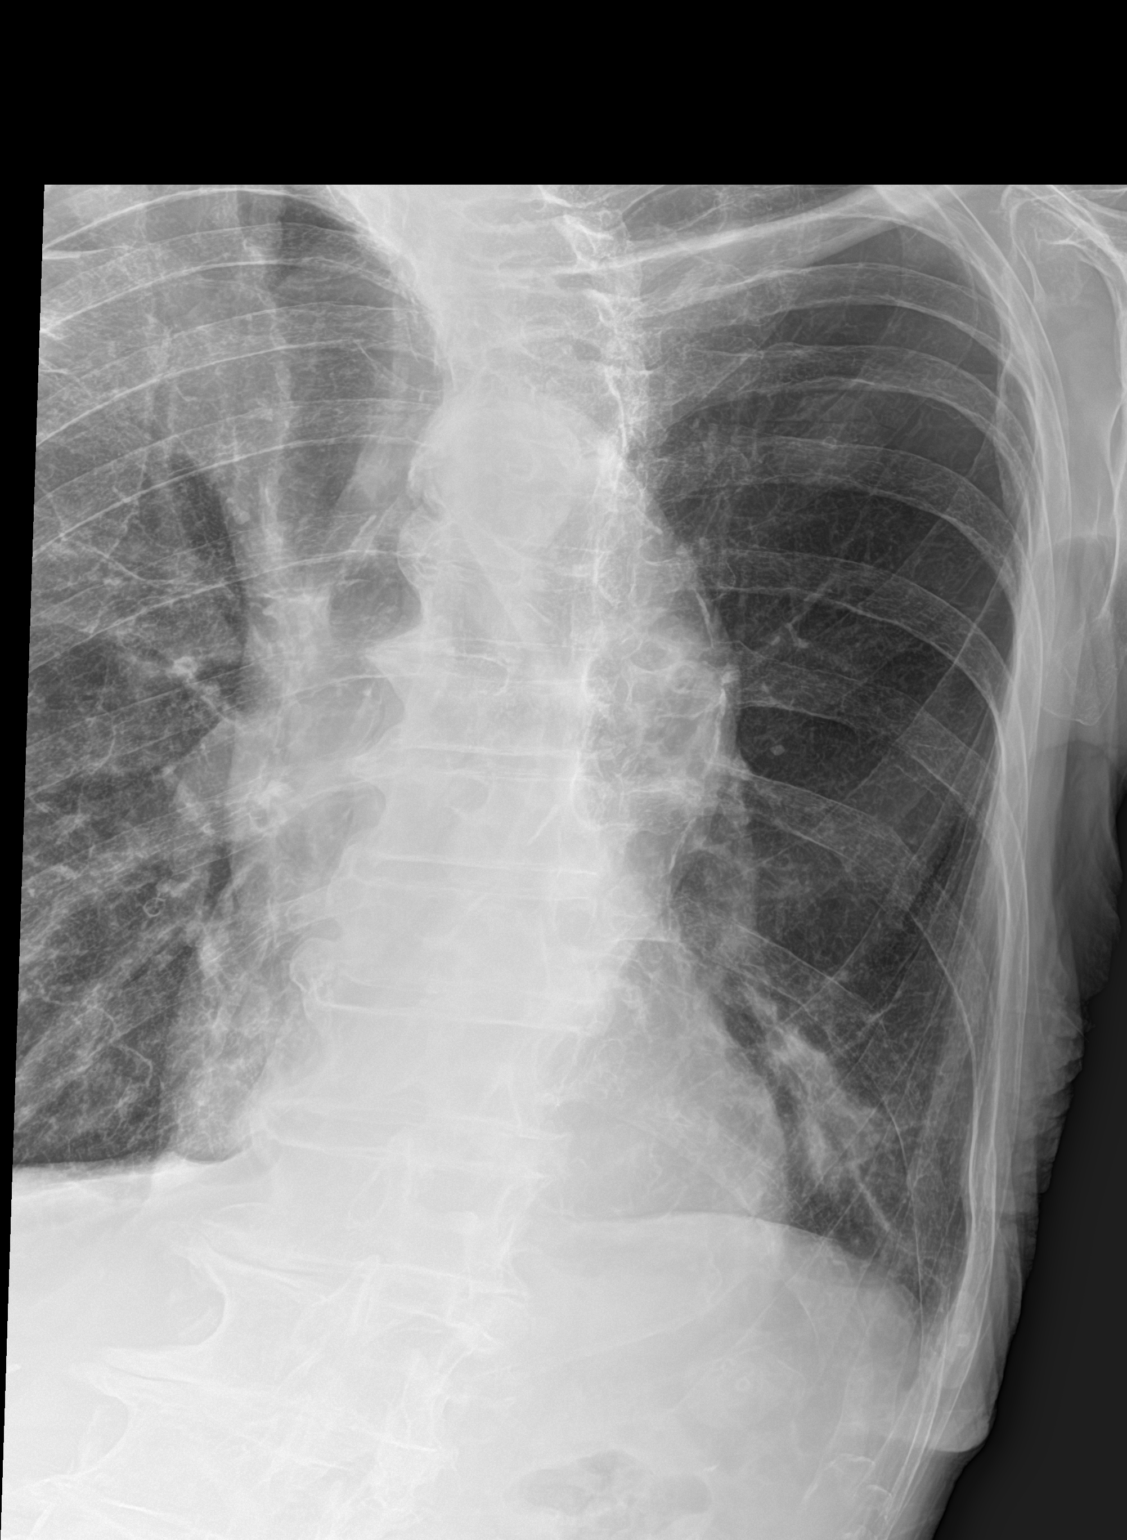

[rib pa obl (2 of 2)]
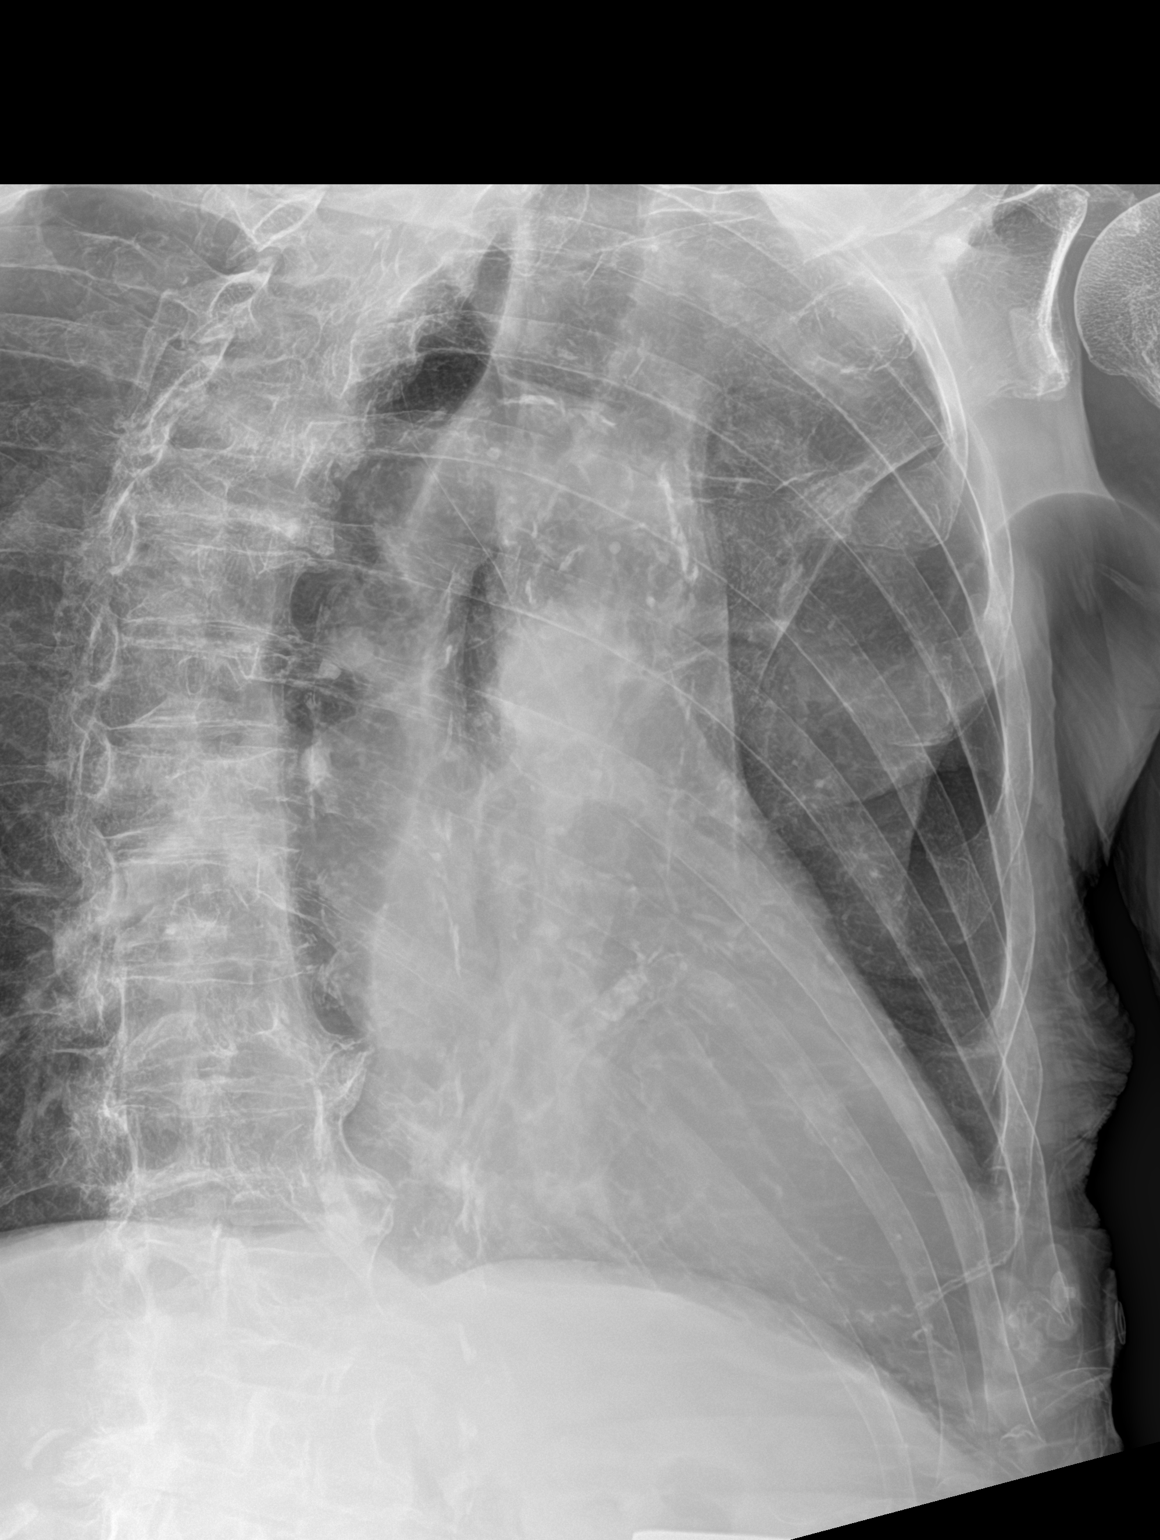

[3 of 3 positions shown; findings below may reference images not displayed]

FINDINGS: No displaced rib fractures are seen.

The lungs are hyperexpanded, with flattening of the hemidiaphragms,
compatible with COPD. Mild left opacity raises concern for
pneumonia. There is no evidence of pleural effusion or pneumothorax.

The cardiomediastinal silhouette is within normal limits. No acute
osseous abnormalities are seen.
IMPRESSION: 1. Mild left basilar airspace opacity raises concern for pneumonia.
2. No displaced rib fracture seen.
3. Findings of COPD.

## 2017-02-23 IMAGING — CR DG CHEST 1V PORT
1 series · 1 of 1 positions shown · non-contrast
Comparison: 08/14/2015

CLINICAL DATA: Pt states that he is feeling pretty good this
morning with no immediate complaints. Traumatic hemopneumothorax per
chart.

EXAM:
PORTABLE CHEST 1 VIEW

[AP]
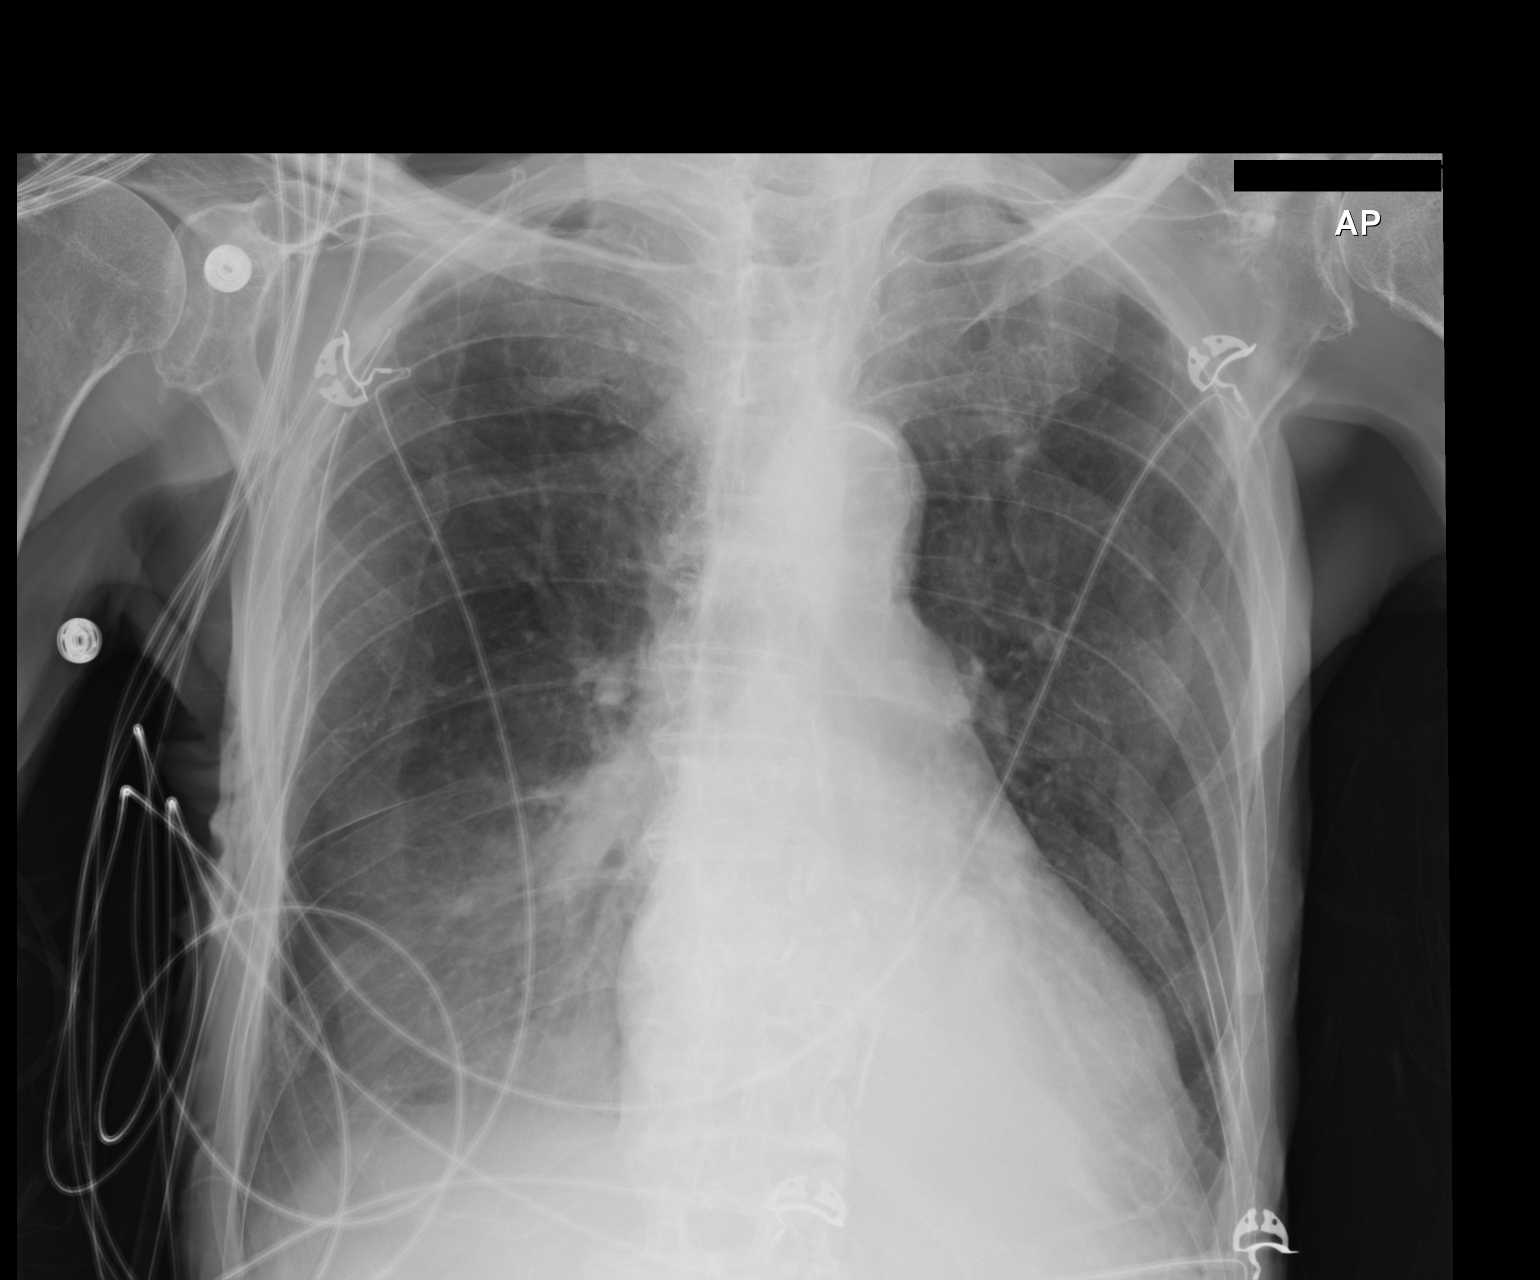

[1 of 1 positions shown; findings below may reference images not displayed]

FINDINGS: RIGHT chest tube in place. No appreciable pneumothorax headache
packed. Small lateral pneumothorax at the RIGHT lung base. RIGHT rib
fractures noted. Small RIGHT effusion

Cardiac silhouette is stable grew LEFT lung clear.
IMPRESSION: 1. No significant change.
2. Small lateral RIGHT basilar pneumothorax with chest tube in place
.
3. Small RIGHT effusion and rib fractures.
